# Patient Record
Sex: Male | Born: 1990 | Race: White | Hispanic: No | Marital: Single | Smoking: Never smoker
Health system: Southern US, Community
[De-identification: ages and names within clinical notes are randomized; demographics above are authoritative.]

## PROBLEM LIST (undated history)

## (undated) DIAGNOSIS — K219 Gastro-esophageal reflux disease without esophagitis: Secondary | ICD-10-CM

---
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
## 2014-01-14 ENCOUNTER — Emergency Department: Payer: Self-pay | Admitting: Emergency Medicine

## 2016-10-12 DIAGNOSIS — E039 Hypothyroidism, unspecified: Secondary | ICD-10-CM | POA: Insufficient documentation

## 2017-01-13 DIAGNOSIS — Z6841 Body Mass Index (BMI) 40.0 and over, adult: Secondary | ICD-10-CM

## 2017-03-12 ENCOUNTER — Emergency Department
Admission: EM | Admit: 2017-03-12 | Discharge: 2017-03-12 | Disposition: A | Discharge status: Home / Self Care | Payer: Self-pay | Attending: Emergency Medicine | Admitting: Emergency Medicine

## 2017-03-12 ENCOUNTER — Encounter: Payer: Self-pay | Admitting: Emergency Medicine

## 2017-03-12 DIAGNOSIS — F418 Other specified anxiety disorders: Secondary | ICD-10-CM | POA: Insufficient documentation

## 2017-03-12 DIAGNOSIS — Z711 Person with feared health complaint in whom no diagnosis is made: Secondary | ICD-10-CM | POA: Insufficient documentation

## 2017-03-12 DIAGNOSIS — N481 Balanitis: Secondary | ICD-10-CM | POA: Insufficient documentation

## 2017-03-12 DIAGNOSIS — K1120 Sialoadenitis, unspecified: Secondary | ICD-10-CM | POA: Insufficient documentation

## 2017-03-12 LAB — CBC WITH DIFFERENTIAL/PLATELET
BASOS ABS: 0 10*3/uL (ref 0–0.1)
BASOS PCT: 0 %
EOS PCT: 5 %
Eosinophils Absolute: 0.5 10*3/uL (ref 0–0.7)
HCT: 42.2 % (ref 40.0–52.0)
Hemoglobin: 14.3 g/dL (ref 13.0–18.0)
LYMPHS PCT: 21 %
Lymphs Abs: 2.2 10*3/uL (ref 1.0–3.6)
MCH: 28 pg (ref 26.0–34.0)
MCHC: 33.8 g/dL (ref 32.0–36.0)
MCV: 82.9 fL (ref 80.0–100.0)
Monocytes Absolute: 0.7 10*3/uL (ref 0.2–1.0)
Monocytes Relative: 7 %
NEUTROS ABS: 7.3 10*3/uL — AB (ref 1.4–6.5)
Neutrophils Relative %: 67 %
PLATELETS: 298 10*3/uL (ref 150–440)
RBC: 5.09 MIL/uL (ref 4.40–5.90)
RDW: 13.1 % (ref 11.5–14.5)
WBC: 10.7 10*3/uL — AB (ref 3.8–10.6)

## 2017-03-12 LAB — COMPREHENSIVE METABOLIC PANEL
ALBUMIN: 4.4 g/dL (ref 3.5–5.0)
ALT: 21 U/L (ref 17–63)
AST: 24 U/L (ref 15–41)
Alkaline Phosphatase: 83 U/L (ref 38–126)
Anion gap: 10 (ref 5–15)
BUN: 7 mg/dL (ref 6–20)
CALCIUM: 9.4 mg/dL (ref 8.9–10.3)
CO2: 24 mmol/L (ref 22–32)
Chloride: 104 mmol/L (ref 101–111)
Creatinine, Ser: 0.63 mg/dL (ref 0.61–1.24)
GFR calc Af Amer: >60 mL/min (ref >60)
GFR calc non Af Amer: >60 mL/min (ref >60)
Glucose, Bld: 134 mg/dL — ABNORMAL HIGH (ref 65–99)
POTASSIUM: 3.8 mmol/L (ref 3.5–5.1)
SODIUM: 138 mmol/L (ref 135–145)
TOTAL PROTEIN: 8.3 g/dL — AB (ref 6.5–8.1)
Total Bilirubin: 0.6 mg/dL (ref 0.3–1.2)

## 2017-03-12 LAB — URINALYSIS, COMPLETE (UACMP) WITH MICROSCOPIC
BILIRUBIN URINE: NEGATIVE
Bacteria, UA: NONE SEEN
GLUCOSE, UA: NEGATIVE mg/dL
HGB URINE DIPSTICK: NEGATIVE
Ketones, ur: NEGATIVE mg/dL
LEUKOCYTES UA: NEGATIVE
NITRITE: NEGATIVE
PROTEIN: NEGATIVE mg/dL
RBC / HPF: NONE SEEN RBC/hpf (ref 0–5)
Specific Gravity, Urine: 1.004 — ABNORMAL LOW (ref 1.005–1.030)
pH: 7 (ref 5.0–8.0)

## 2017-03-12 LAB — CHLAMYDIA/NGC RT PCR (ARMC ONLY)
Chlamydia Tr: NOT DETECTED
N gonorrhoeae: NOT DETECTED

## 2017-03-12 LAB — RAPID HIV SCREEN (HIV 1/2 AB+AG)
HIV 1/2 ANTIBODIES: NONREACTIVE
HIV-1 P24 Antigen - HIV24: NONREACTIVE

## 2017-03-12 MED ORDER — LORAZEPAM 2 MG/ML IJ SOLN
1.0000 mg | Freq: Once | INTRAMUSCULAR | Status: AC
  Administered 2017-03-12: 1 mg via INTRAVENOUS
  Filled 2017-03-12: qty 1

## 2017-03-12 MED ORDER — MUPIROCIN CALCIUM 2 % EX CREA: TOPICAL_CREAM | CUTANEOUS | 0 refills | Status: DC

## 2017-03-12 MED ORDER — AMOXICILLIN-POT CLAVULANATE 875-125 MG PO TABS: 1.0000 | ORAL_TABLET | Freq: Two times a day (BID) | ORAL | 0 refills | Status: DC

## 2017-03-12 NOTE — ED Notes (Signed)
Right hand IV removed.  IV intact and bleeding controlled.

## 2017-03-12 NOTE — ED Notes (Signed)
Spoke with Dr Alphonzo Lemmings and states he can go to flex

## 2017-03-12 NOTE — ED Notes (Signed)
Has been seen by PCP in June for groin rash  Given cream for same

## 2017-03-12 NOTE — ED Triage Notes (Addendum)
States he is having a anxiety attack  States he has had sores in mouth and on penis  Also now having neck pain and some n/v    Sx's started with in 1 year  States he really needs to talk with someone

## 2017-03-12 NOTE — ED Provider Notes (Signed)
[facility] Emergency Department Provider Note  ____________________________________________  Time seen: Approximately 12:29 PM  I have reviewed the triage vital signs and the nursing notes.   HISTORY  Chief Complaint Panic Attack    HPI Frederick Chandler is a 26 y.o. male who presents emergency department concern for anxiety symptoms.Patient reports that he has had diffuse, nonspecific symptoms over the past year. Patient reports that he does look his symptoms on google and Web M.D. and is concerned as he has seen HIV as a differential multiple times. Patient reports that he has a history of underlying anxiety for which she believes he should see psychiatry but has not done so. Patient denies any suicidal or homicidal ideations. Patient reports that he has extreme anxiety over medical health. She reports that he has had aphthous ulcers 1 year, GERD-like symptoms, intermittent abdominal pain, intermittent diarrhea. Patient reports that he has had a penile rash that was evaluated by primary care and diagnosed with fungal jock itch. Patient reports he has been on a topical with no relief of symptoms the rash has improved. At this time, patient is requesting HIV testing. Patient denies any headache, visual changes, chest pain, shortness of breath, abdominal pain currently, nausea or vomiting, diarrhea or constipation. No dysuria or polyuria. No hematuria.   History reviewed. No pertinent past medical history.  There are no active problems to display for this patient.   History reviewed. No pertinent surgical history.  Prior to Admission medications   Medication Sig Start Date End Date Taking? Authorizing Provider  amoxicillin-clavulanate (AUGMENTIN) 875-125 MG tablet Take 1 tablet by mouth 2 (two) times daily. 03/12/17   Cuthriell, Delorise Royals, PA-C  mupirocin cream (BACTROBAN) 2 % Apply to affected area 3 times daily 03/12/17   Cuthriell, Delorise Royals, PA-C     Allergies Patient has no known allergies.  No family history on file.  Social History Social History  Substance Use Topics  . Smoking status: Never Smoker  . Smokeless tobacco: Never Used  . Alcohol use No     Review of Systems  Constitutional: No fever/chills Eyes: No visual changes. No discharge ENT: Positive for aphthous ulcers to the right upper gum. Cardiovascular: no chest pain. Respiratory: no cough. No SOB. Gastrointestinal: Intermittent abdominal pain but none currently.  No nausea, no vomiting.  No diarrhea.  No constipation. Genitourinary: Negative for dysuria. No hematuria. Penile rash. Musculoskeletal: Negative for musculoskeletal pain. Skin: Negative for rash, abrasions, lacerations, ecchymosis. Neurological: Negative for headaches, focal weakness or numbness. 10-point ROS otherwise negative.  ____________________________________________   PHYSICAL EXAM:  VITAL SIGNS: ED Triage Vitals [03/12/17 1200]  Enc Vitals Group     BP (!) 155/97     Pulse Rate (!) 110     Resp 20     Temp 99.3 F (37.4 C)     Temp Source Oral     SpO2 100 %     Weight 290 lb (131.5 kg)     Height 6\' 2"  (1.88 m)     Head Circumference      Peak Flow      Pain Score      Pain Loc      Pain Edu?      Excl. in GC?      Constitutional: Alert and oriented. Well appearing and in no acute distress. Eyes: Conjunctivae are normal. PERRL. EOMI. Head: Atraumatic. ENT:      Ears:       Nose: No congestion/rhinnorhea.  Mouth/Throat: Mucous membranes are moist. Aphthous ulcer noted to right buccal lining. Neck: No stridor.  No cervical spine tenderness to palpation. Neck is supple full range of motion. Hematological/Lymphatic/Immunilogical: Mildly enlarged left submandibular lymph node, no other cervical lymphadenopathy. No posterior cervical lymphadenopathy. Cardiovascular: Normal rate, regular rhythm. Normal S1 and S2.  Good peripheral circulation. Respiratory: Normal  respiratory effort without tachypnea or retractions. Lungs CTAB. Good air entry to the bases with no decreased or absent breath sounds. Gastrointestinal: Bowel sounds 4 quadrants. Soft and nontender to palpation. No guarding or rigidity. No palpable masses. No distention. No CVA tenderness. Genitourinary: Mild erythema noted just proximal to the glans of the penis. No lesions were chancres noted. No penile drainage. No tenderness to palpation over the penile shaft. No tenderness to palpation or palpable abnormalities to testicles and scrotum. Musculoskeletal: Full range of motion to all extremities. No gross deformities appreciated. Neurologic:  Normal speech and language. No gross focal neurologic deficits are appreciated.  Skin:  Skin is warm, dry and intact. No rash noted. Psychiatric: Patient appears very anxious.Marland Kitchen Speech and behavior are normal. Patient exhibits appropriate insight and judgement. No homicidal or suicidal ideation.   ____________________________________________   LABS (all labs ordered are listed, but only abnormal results are displayed)  Labs Reviewed  URINALYSIS, COMPLETE (UACMP) WITH MICROSCOPIC - Abnormal; Notable for the following:       Result Value   Color, Urine STRAW (*)    APPearance CLEAR (*)    Specific Gravity, Urine 1.004 (*)    Squamous Epithelial / LPF 0-5 (*)    All other components within normal limits  COMPREHENSIVE METABOLIC PANEL - Abnormal; Notable for the following:    Glucose, Bld 134 (*)    Total Protein 8.3 (*)    All other components within normal limits  CBC WITH DIFFERENTIAL/PLATELET - Abnormal; Notable for the following:    WBC 10.7 (*)    Neutro Abs 7.3 (*)    All other components within normal limits  CHLAMYDIA/NGC RT PCR ([facility] ONLY)  RAPID HIV SCREEN (HIV 1/2 AB+AG)  RPR   ____________________________________________  EKG   ____________________________________________  RADIOLOGY   No results  found.  ____________________________________________    PROCEDURES  Procedure(s) performed:    Procedures    Medications  LORazepam (ATIVAN) injection 1 mg (1 mg Intravenous Given 03/12/17 1312)     ____________________________________________   INITIAL IMPRESSION / ASSESSMENT AND PLAN / ED COURSE  Pertinent labs & imaging results that were available during my care of the patient were reviewed by me and considered in my medical decision making (see chart for details).  Review of the [place] CSRS was performed in accordance of the [facility] prior to dispensing any controlled drugs.     Patient's diagnosis is consistent with severe anxiety about health, feared complaint without diagnosis, sialoadenitis and balanitis. Patient presented to the emergency department with borderline panic attack after anxiety about health to include possible HIV infection. Patient had no known contact with HIV but had looked his nonspecific symptoms for the past year up on global and with Web M.D. And had slef determined he may have HIV. Rapid HIV was negative. Gonorrhea and chlamydia were negative. Patient has multiple symptoms consistent with GERD versus IBS versus Crohn's. I recommend patient follow-up with GI for these complaints. Labs were reassuring with finding of mildly elevated white blood cell count. This is consistent with sialoadenitis on exam. Patient has also been followed by his primary care for rash on his  penis. This appears to be improving with medication but does have a balanitis appearance. Patient will be placed on topical antibiotic for same.. Patient will be discharged home with prescriptions for Augmentin and topical mupirocin. Patient is to follow up with primary care and GI as needed or otherwise directed. Patient is given ED precautions to return to the ED for any worsening or new symptoms.     ____________________________________________  FINAL CLINICAL IMPRESSION(S) / ED  DIAGNOSES  Final diagnoses:  Sialoadenitis  Balanitis  Anxiety about health  Feared complaint without diagnosis      NEW MEDICATIONS STARTED DURING THIS VISIT:  New Prescriptions   AMOXICILLIN-CLAVULANATE (AUGMENTIN) 875-125 MG TABLET    Take 1 tablet by mouth 2 (two) times daily.   MUPIROCIN CREAM (BACTROBAN) 2 %    Apply to affected area 3 times daily        This chart was dictated using voice recognition software/Dragon. Despite best efforts to proofread, errors can occur which can change the meaning. Any change was purely unintentional.    Kalim, Kissel, PA-C 03/12/17 1527    Governor Rooks, MD 03/13/17 1524

## 2017-03-13 LAB — RPR: RPR Ser Ql: NONREACTIVE

## 2017-03-29 ENCOUNTER — Other Ambulatory Visit
Admission: RE | Admit: 2017-03-29 | Discharge: 2017-03-29 | Disposition: A | Discharge status: Home / Self Care | Payer: Self-pay | Source: Ambulatory Visit | Attending: Gastroenterology | Admitting: Gastroenterology

## 2017-03-29 ENCOUNTER — Ambulatory Visit (INDEPENDENT_AMBULATORY_CARE_PROVIDER_SITE_OTHER): Payer: Self-pay | Admitting: Gastroenterology

## 2017-03-29 ENCOUNTER — Encounter: Payer: Self-pay | Admitting: Gastroenterology

## 2017-03-29 VITALS — BP 128/85 | HR 111 | Temp 98.2°F | Wt 305.6 lb

## 2017-03-29 DIAGNOSIS — R109 Unspecified abdominal pain: Secondary | ICD-10-CM | POA: Insufficient documentation

## 2017-03-29 DIAGNOSIS — K625 Hemorrhage of anus and rectum: Secondary | ICD-10-CM | POA: Insufficient documentation

## 2017-03-29 DIAGNOSIS — K219 Gastro-esophageal reflux disease without esophagitis: Secondary | ICD-10-CM

## 2017-03-29 DIAGNOSIS — R140 Abdominal distension (gaseous): Secondary | ICD-10-CM

## 2017-03-29 LAB — TSH: TSH: 3.505 u[IU]/mL (ref 0.350–4.500)

## 2017-03-29 MED ORDER — OMEPRAZOLE 40 MG PO CPDR: 40.0000 mg | DELAYED_RELEASE_CAPSULE | Freq: Every day | ORAL | 3 refills | Status: AC

## 2017-03-29 MED ORDER — OMEPRAZOLE 40 MG PO CPDR: 40.0000 mg | DELAYED_RELEASE_CAPSULE | Freq: Every day | ORAL | 3 refills | Status: DC

## 2017-03-29 NOTE — Progress Notes (Signed)
Wyline Mood MD, MRCP(U.K) [address]  [address]  [place], [place] [address]  Main: [phone]  Fax: [phone]   Gastroenterology Consultation  Referring Provider:     Nira Retort Primary Care Physician:  Nira Retort Primary Gastroenterologist:  Dr. Wyline Mood  Reason for Consultation:     *Abdominal pain         HPI:   Frederick Chandler is a 26 y.o. y/o Frederick Chandler has been referred by the ER after he visited on 03/12/17 , he has a history of anxiety . He was referred to see me for a variety of symptoms with concerns for GERD,IBD.  He says that a year back noticed some blood in his stool every time he had a bowel movement.Recurred 6 days back, saw bright red blood in the toilet bowel, no hard stool , presently no blood in his stools, some change in the shape of his stools, no colonoscopy previously . Grand dad had possible colon cancer. Recently gained weight.   Says he has had nausea for a year, better if he eats, does not throw up , better when he takes a hot shower, no cigarettes , no exposure to marijuana. Denies any illegal drug use. Solids and liquids equally affected. No heartburn.   Abdominal pain - 1 year, all over his abdomen , chest , past several days lower down , radiates to up and down , describes the pain as dull , does not wake up from sleep . Each episode lasts for a few hours and then eases off, no agrevating or relieving factors, no nsaid use. Not related to meals.   Excess of gas a year, bloating , abdominal pain worse when bloated, when he passes gas feels better, very foul smelling gas. Denies use of artificial sugars, stools are sloppy and mushy .     Labs 03/12/17: CMP,CBC- Normal  No past medical history on file.  No past surgical history on file.  Prior to Admission medications   Medication Sig Start Date End Date Taking? Authorizing Provider  fluticasone (FLONASE) 50 MCG/ACT nasal spray Place into the nose. 10/12/16 10/12/17 Yes  [provider]  hydrocortisone cream 1 % Apply topically 2 (two) times daily for 14 days 03/07/17  Yes [provider]  amoxicillin-clavulanate (AUGMENTIN) 875-125 MG tablet Take 1 tablet by mouth 2 (two) times daily. 03/12/17   Cuthriell, Delorise Royals, PA-C  mupirocin cream (BACTROBAN) 2 % Apply to affected area 3 times daily 03/12/17   Cuthriell, Delorise Royals, PA-C    No family history on file.   Social History  Substance Use Topics  . Smoking status: Never Smoker  . Smokeless tobacco: Never Used  . Alcohol use No    Allergies as of 03/29/2017  . (No Known Allergies)    Review of Systems:    All systems reviewed and negative except where noted in HPI.   Physical Exam:  There were no vitals taken for this visit. No LMP for male patient. Psych:  Alert and cooperative. Normal mood and affect. General:   Alert,  Well-developed, well-nourished, pleasant and cooperative in NAD Head:  Normocephalic and atraumatic. Eyes:  Sclera clear, no icterus.   Conjunctiva pink. Ears:  Normal auditory acuity. Nose:  No deformity, discharge, or lesions. Mouth:  No deformity or lesions,oropharynx pink & moist. Neck:  Supple; no masses or thyromegaly. Lungs:  Respirations even and unlabored.  Clear throughout to auscultation.   No wheezes, crackles, or rhonchi. No acute distress.  Heart:  Regular rate and rhythm; no murmurs, clicks, rubs, or gallops. Abdomen:  Normal bowel sounds.  No bruits.  Soft, non-tender and non-distended without masses, hepatosplenomegaly or hernias noted.  No guarding or rebound tenderness.    Msk:  Symmetrical without gross deformities. Good, equal movement & strength bilaterally. Pulses:  Normal pulses noted. Extremities:  No clubbing or edema.  No cyanosis. Neurologic:  Alert and oriented x3;  grossly normal neurologically. Skin:  Intact without significant lesions or rashes. No jaundice. Lymph Nodes:  No significant cervical adenopathy. Psych:  Alert and  cooperative. Normal mood and affect.  Imaging Studies: No results found.  Assessment and Plan:   Frederick Chandler is a 26 y.o. y/o male has been referred for blood in stool , abdominal pain. Likely rectal bleeding from hemorrhoids, lots of bloating which may be due to diet rich in vegetables and fiber , this in turn may be causing the abdominal discomfort.    1. EGD+colonoscopy  2. PPI 3. Check for celiac disease and TSH 4. LOW FODMAP diet  5. If no better at next visit will consider a course of antibiotics to treat SIBO +/- CT scan    I have discussed alternative options, risks & benefits,  which include, but are not limited to, bleeding, infection, perforation,respiratory complication & drug reaction.  The patient agrees with this plan & written consent will be obtained.     Follow up in 6-8 weeks   Dr Wyline MoodKiran Adiva Boettner MD,MRCP(U.K)

## 2017-03-31 LAB — CELIAC PANEL 10
Antigliadin Abs, IgA: 7 units (ref 0–19)
Endomysial Ab, IgA: NEGATIVE
GLIADIN IGG: 3 U (ref 0–19)
IGA: 417 mg/dL — AB (ref 90–386)
Tissue Transglut Ab: <2 U/mL (ref 0–5)

## 2017-04-01 ENCOUNTER — Telehealth: Payer: Self-pay

## 2017-04-01 NOTE — Telephone Encounter (Signed)
Advised patient of results per Dr. Anna.  

## 2017-04-01 NOTE — Telephone Encounter (Signed)
-----   Message from Wyline MoodKiran Anna, MD sent at 03/31/2017  8:58 AM EDT ----- Celiac serology negative

## 2017-04-21 ENCOUNTER — Ambulatory Visit: Payer: Self-pay | Admitting: Anesthesiology

## 2017-04-21 ENCOUNTER — Ambulatory Visit
Admission: RE | Admit: 2017-04-21 | Discharge: 2017-04-21 | Disposition: A | Discharge status: Home / Self Care | Payer: Self-pay | Source: Ambulatory Visit | Attending: Gastroenterology | Admitting: Gastroenterology

## 2017-04-21 ENCOUNTER — Encounter
Admission: RE | Disposition: A | Discharge status: Home / Self Care | Payer: Self-pay | Source: Ambulatory Visit | Attending: Gastroenterology

## 2017-04-21 ENCOUNTER — Encounter: Payer: Self-pay | Admitting: *Deleted

## 2017-04-21 DIAGNOSIS — K625 Hemorrhage of anus and rectum: Secondary | ICD-10-CM | POA: Insufficient documentation

## 2017-04-21 DIAGNOSIS — K2950 Unspecified chronic gastritis without bleeding: Secondary | ICD-10-CM | POA: Insufficient documentation

## 2017-04-21 DIAGNOSIS — K640 First degree hemorrhoids: Secondary | ICD-10-CM | POA: Insufficient documentation

## 2017-04-21 DIAGNOSIS — R109 Unspecified abdominal pain: Secondary | ICD-10-CM

## 2017-04-21 DIAGNOSIS — Z79899 Other long term (current) drug therapy: Secondary | ICD-10-CM | POA: Insufficient documentation

## 2017-04-21 DIAGNOSIS — Z6837 Body mass index (BMI) 37.0-37.9, adult: Secondary | ICD-10-CM | POA: Insufficient documentation

## 2017-04-21 DIAGNOSIS — K319 Disease of stomach and duodenum, unspecified: Secondary | ICD-10-CM

## 2017-04-21 DIAGNOSIS — R140 Abdominal distension (gaseous): Secondary | ICD-10-CM

## 2017-04-21 DIAGNOSIS — K219 Gastro-esophageal reflux disease without esophagitis: Secondary | ICD-10-CM | POA: Insufficient documentation

## 2017-04-21 HISTORY — PX: COLONOSCOPY WITH PROPOFOL: SHX5780

## 2017-04-21 HISTORY — PX: ESOPHAGOGASTRODUODENOSCOPY (EGD) WITH PROPOFOL: SHX5813

## 2017-04-21 HISTORY — DX: Gastro-esophageal reflux disease without esophagitis: K21.9

## 2017-04-21 SURGERY — ESOPHAGOGASTRODUODENOSCOPY (EGD) WITH PROPOFOL
Anesthesia: General

## 2017-04-21 MED ORDER — FENTANYL CITRATE (PF) 100 MCG/2ML IJ SOLN
INTRAMUSCULAR | Status: AC
  Filled 2017-04-21: qty 2

## 2017-04-21 MED ORDER — LIDOCAINE 2% (20 MG/ML) 5 ML SYRINGE
INTRAMUSCULAR | Status: DC | PRN
  Administered 2017-04-21: 40 mg via INTRAVENOUS

## 2017-04-21 MED ORDER — SODIUM CHLORIDE 0.9 % IV SOLN
INTRAVENOUS | Status: DC
Start: 2017-04-21 — End: 2017-04-21
  Administered 2017-04-21: 11:00:00 via INTRAVENOUS
  Administered 2017-04-21: 1000 mL via INTRAVENOUS

## 2017-04-21 MED ORDER — FENTANYL CITRATE (PF) 100 MCG/2ML IJ SOLN
INTRAMUSCULAR | Status: DC | PRN
  Administered 2017-04-21 (×2): 50 ug via INTRAVENOUS

## 2017-04-21 MED ORDER — PROPOFOL 10 MG/ML IV BOLUS
INTRAVENOUS | Status: DC | PRN
  Administered 2017-04-21: 100 mg via INTRAVENOUS

## 2017-04-21 MED ORDER — PHENYLEPHRINE HCL 10 MG/ML IJ SOLN
INTRAMUSCULAR | Status: DC | PRN
  Administered 2017-04-21: 100 ug via INTRAVENOUS

## 2017-04-21 MED ORDER — PROPOFOL 500 MG/50ML IV EMUL
INTRAVENOUS | Status: DC | PRN
  Administered 2017-04-21: 180 ug/kg/min via INTRAVENOUS

## 2017-04-21 MED ORDER — MIDAZOLAM HCL 2 MG/2ML IJ SOLN
INTRAMUSCULAR | Status: AC
  Filled 2017-04-21: qty 2

## 2017-04-21 MED ORDER — MIDAZOLAM HCL 5 MG/5ML IJ SOLN
INTRAMUSCULAR | Status: DC | PRN
  Administered 2017-04-21 (×2): 1 mg via INTRAVENOUS

## 2017-04-21 MED ORDER — LIDOCAINE HCL (PF) 2 % IJ SOLN
INTRAMUSCULAR | Status: AC
  Filled 2017-04-21: qty 2

## 2017-04-21 NOTE — Op Note (Signed)
[facility] Gastroenterology Patient Name: Frederick Chandler Procedure Date: 04/21/2017 11:32 AM MRN: [id number] Account #: [id number] Date of Birth: May 24, 1991 Admit Type: Outpatient Age: 26 Room: [facility] ENDO ROOM 4 Gender: Male Note Status: Finalized Procedure:            Upper GI endoscopy Indications:          Dyspepsia Providers:            Wyline Mood MD, MD Referring MD:         No Local Md, MD (Referring MD) Medicines:            Monitored Anesthesia Care Complications:        No immediate complications. Procedure:            Pre-Anesthesia Assessment:                       - The risks and benefits of the procedure and the                        sedation options and risks were discussed with the                        patient. All questions were answered and informed                        consent was obtained.                       - ASA Grade Assessment: II - A patient with mild                        systemic disease.                       After obtaining informed consent, the endoscope was                        passed under direct vision. Throughout the procedure,                        the patient's blood pressure, pulse, and oxygen                        saturations were monitored continuously. The Endoscope                        was introduced through the mouth, and advanced to the                        third part of duodenum. The upper GI endoscopy was                        accomplished with ease. The patient tolerated the                        procedure well. Findings:      The stomach was normal.      The esophagus was normal.      The entire examined stomach was normal. Biopsies were taken with a cold       forceps for histology.      A  fold appeared thickened and nodular at the junction of the bulb and       second portion of the duodenum. Biopsies were taken with a cold forceps       for histology. Impression:           - Normal stomach.                      - Normal esophagus.                       - Normal stomach. Biopsied.                       - Mucosal changes in the duodenum. Biopsied. Recommendation:       - Await pathology results.                       - Perform a colonoscopy today. Procedure Code(s):    --- Professional ---                       [phone], Esophagogastroduodenoscopy, flexible, transoral;                        with biopsy, single or multiple Diagnosis Code(s):    --- Professional ---                       K31.89, Other diseases of stomach and duodenum                       R10.13, Epigastric pain CPT copyright 2016 American Medical Association. All rights reserved. The codes documented in this report are preliminary and upon coder review may  be revised to meet current compliance requirements. Wyline Mood, MD Wyline Mood MD, MD 04/21/2017 11:46:02 AM This report has been signed electronically. Number of Addenda: 0 Note Initiated On: 04/21/2017 11:32 AM      [facility]

## 2017-04-21 NOTE — Transfer of Care (Signed)
Immediate Anesthesia Transfer of Care Note  Patient: Frederick Chandler  Procedure(s) Performed: Procedure(s): ESOPHAGOGASTRODUODENOSCOPY (EGD) WITH PROPOFOL (N/A) COLONOSCOPY WITH PROPOFOL (N/A)  Patient Location: PACU and Endoscopy Unit  Anesthesia Type:General  Level of Consciousness: drowsy  Airway & Oxygen Therapy: Patient Spontanous Breathing and Patient connected to nasal cannula oxygen  Post-op Assessment: Report given to RN and Post -op Vital signs reviewed and stable  Post vital signs: Reviewed and stable  Last Vitals:  Vitals:   04/21/17 0952  BP: 111/81  Pulse: 95  Resp: 16  Temp: (!) 35.6 C  SpO2: 100%    Last Pain:  Vitals:   04/21/17 0952  TempSrc: Tympanic         Complications: No apparent anesthesia complications

## 2017-04-21 NOTE — Anesthesia Preprocedure Evaluation (Signed)
Anesthesia Evaluation  Patient identified by MRN, date of birth, ID band Patient awake    Reviewed: Allergy & Precautions, H&P , NPO status , Patient's Chart, lab work & pertinent test results, reviewed documented beta blocker date and time   Airway Mallampati: II   Neck ROM: full    Dental  (+) Teeth Intact   Pulmonary neg pulmonary ROS,    Pulmonary exam normal        Cardiovascular Exercise Tolerance: Good negative cardio ROS Normal cardiovascular exam Rhythm:regular Rate:Normal     Neuro/Psych negative neurological ROS  negative psych ROS   GI/Hepatic negative GI ROS, Neg liver ROS, GERD  Medicated,  Endo/Other  negative endocrine ROSHypothyroidism Morbid obesity  Renal/GU negative Renal ROS  negative genitourinary   Musculoskeletal   Abdominal   Peds  Hematology negative hematology ROS (+)   Anesthesia Other Findings Past Medical History: No date: GERD (gastroesophageal reflux disease) No past surgical history on file. BMI    Body Mass Index:  37.88 kg/m     Reproductive/Obstetrics negative OB ROS                             Anesthesia Physical Anesthesia Plan  ASA: II  Anesthesia Plan: General   Post-op Pain Management:    Induction:   PONV Risk Score and Plan:   Airway Management Planned:   Additional Equipment:   Intra-op Plan:   Post-operative Plan:   Informed Consent: I have reviewed the patients History and Physical, chart, labs and discussed the procedure including the risks, benefits and alternatives for the proposed anesthesia with the patient or authorized representative who has indicated his/her understanding and acceptance.   Dental Advisory Given  Plan Discussed with: CRNA  Anesthesia Plan Comments:         Anesthesia Quick Evaluation

## 2017-04-21 NOTE — Anesthesia Post-op Follow-up Note (Signed)
Anesthesia QCDR form completed.        

## 2017-04-21 NOTE — Op Note (Signed)
[facility] Gastroenterology Patient Name: Frederick Chandler Procedure Date: 04/21/2017 11:32 AM MRN: [id number] Account #: [id number] Date of Birth: 07/25/1991 Admit Type: Outpatient Age: 26 Room: [facility] ENDO ROOM 4 Gender: Male Note Status: Finalized Procedure:            Colonoscopy Indications:          Rectal bleeding Providers:            Wyline Mood MD, MD Referring MD:         No Local Md, MD (Referring MD) Medicines:            Monitored Anesthesia Care Complications:        No immediate complications. Procedure:            Pre-Anesthesia Assessment:                       - Prior to the procedure, a History and Physical was                        performed, and patient medications, allergies and                        sensitivities were reviewed. The patient's tolerance of                        previous anesthesia was reviewed.                       - The risks and benefits of the procedure and the                        sedation options and risks were discussed with the                        patient. All questions were answered and informed                        consent was obtained.                       - ASA Grade Assessment: II - A patient with mild                        systemic disease.                       After obtaining informed consent, the colonoscope was                        passed under direct vision. Throughout the procedure,                        the patient's blood pressure, pulse, and oxygen                        saturations were monitored continuously. The                        Colonoscope was introduced through the anus and                        advanced  to the the cecum, identified by the                        appendiceal orifice, IC valve and transillumination.                        The colonoscopy was performed with ease. The patient                        tolerated the procedure well. The quality of the bowel           preparation was good. Findings:      The perianal and digital rectal examinations were normal.      Non-bleeding internal hemorrhoids were found during retroflexion. The       hemorrhoids were small and Grade I (internal hemorrhoids that do not       prolapse).      The exam was otherwise without abnormality on direct and retroflexion       views. Impression:           - Non-bleeding internal hemorrhoids.                       - The examination was otherwise normal on direct and                        retroflexion views.                       - No specimens collected. Recommendation:       - Discharge patient to home (with escort).                       - Resume previous diet.                       - Continue present medications.                       - Return to my office in 2 months. Procedure Code(s):    --- Professional ---                       [phone], Colonoscopy, flexible; diagnostic, including                        collection of specimen(s) by brushing or washing, when                        performed (separate procedure) Diagnosis Code(s):    --- Professional ---                       K64.0, First degree hemorrhoids                       K62.5, Hemorrhage of anus and rectum CPT copyright 2016 American Medical Association. All rights reserved. The codes documented in this report are preliminary and upon coder review may  be revised to meet current compliance requirements. Wyline Mood, MD Wyline Mood MD, MD 04/21/2017 11:58:23 AM This report has been signed electronically. Number of Addenda: 0 Note Initiated On: 04/21/2017 11:32 AM Scope Withdrawal Time: 0 hours 7 minutes 50 seconds  Total Procedure  Duration: 0 hours 9 minutes 32 seconds       [facility]

## 2017-04-21 NOTE — H&P (Signed)
  Wyline Mood MD [address] [place], [place] [address] Phone: [phone] Fax : [phone]  Primary Care Physician:  Nira Retort Primary Gastroenterologist:  Dr. Wyline Mood   Pre-Procedure History & Physical: HPI:  Frederick Chandler is a 26 y.o. male is here for an endoscopy and colonoscopy.   Past Medical History:  Diagnosis Date  . GERD (gastroesophageal reflux disease)     No past surgical history on file.  Prior to Admission medications   Medication Sig Start Date End Date Taking? Authorizing Provider  omeprazole (PRILOSEC) 40 MG capsule Take 1 capsule (40 mg total) by mouth daily. 03/29/17  Yes Wyline Mood, MD    Allergies as of 03/29/2017  . (No Known Allergies)    No family history on file.  Social History   Social History  . Marital status: Single    Spouse name: N/A  . Number of children: N/A  . Years of education: N/A   Occupational History  . Not on file.   Social History Main Topics  . Smoking status: Never Smoker  . Smokeless tobacco: Never Used  . Alcohol use No  . Drug use: No  . Sexual activity: Not on file   Other Topics Concern  . Not on file   Social History Narrative  . No narrative on file    Review of Systems: See HPI, otherwise negative ROS  Physical Exam: BP 111/81   Pulse 95   Temp (!) 96 F (35.6 C) (Tympanic)   Resp 16   Ht  (1.88 m)   Wt 295 lb (133.8 kg)   SpO2 100%   BMI 37.88 kg/m  General:   Alert,  pleasant and cooperative in NAD Head:  Normocephalic and atraumatic. Neck:  Supple; no masses or thyromegaly. Lungs:  Clear throughout to auscultation.    Heart:  Regular rate and rhythm. Abdomen:  Soft, nontender and nondistended. Normal bowel sounds, without guarding, and without rebound.   Neurologic:  Alert and  oriented x4;  grossly normal neurologically.  Impression/Plan: Frederick Chandler is here for an endoscopy and colonoscopy to be performed for abdominal pain and rectal bleeding    Risks, benefits, limitations, and alternatives regarding  endoscopy and colonoscopy have been reviewed with the patient.  Questions have been answered.  All parties agreeable.   Wyline Mood, MD  04/21/2017, 10:50 AM

## 2017-04-22 ENCOUNTER — Encounter: Payer: Self-pay | Admitting: Gastroenterology

## 2017-04-25 LAB — SURGICAL PATHOLOGY

## 2017-04-25 NOTE — Anesthesia Postprocedure Evaluation (Signed)
Anesthesia Post Note  Patient: DIMAS SCHECK  Procedure(s) Performed: ESOPHAGOGASTRODUODENOSCOPY (EGD) WITH PROPOFOL (N/A ) COLONOSCOPY WITH PROPOFOL (N/A )  Patient location during evaluation: PACU Anesthesia Type: General Level of consciousness: awake and alert Pain management: pain level controlled Vital Signs Assessment: post-procedure vital signs reviewed and stable Respiratory status: spontaneous breathing, nonlabored ventilation, respiratory function stable and patient connected to nasal cannula oxygen Cardiovascular status: blood pressure returned to baseline and stable Postop Assessment: no apparent nausea or vomiting Anesthetic complications: no     Last Vitals:  Vitals:   04/21/17 1236 04/21/17 1246  BP: 112/79 122/80  Pulse: 80 73  Resp: 15 20  Temp:    SpO2: 99% 99%    Last Pain:  Vitals:   04/22/17 0746  TempSrc:   PainSc: 0-No pain                 Yevette Edwards

## 2017-05-01 ENCOUNTER — Encounter: Payer: Self-pay | Admitting: Gastroenterology

## 2017-07-07 ENCOUNTER — Ambulatory Visit: Payer: Self-pay | Admitting: Gastroenterology

## 2018-10-03 ENCOUNTER — Other Ambulatory Visit: Payer: Self-pay | Admitting: Gastroenterology

## 2018-10-03 DIAGNOSIS — R140 Abdominal distension (gaseous): Secondary | ICD-10-CM

## 2018-10-03 DIAGNOSIS — K625 Hemorrhage of anus and rectum: Secondary | ICD-10-CM

## 2018-10-03 DIAGNOSIS — K219 Gastro-esophageal reflux disease without esophagitis: Secondary | ICD-10-CM

## 2018-10-03 DIAGNOSIS — R109 Unspecified abdominal pain: Secondary | ICD-10-CM

## 2019-10-29 ENCOUNTER — Ambulatory Visit: Payer: Self-pay | Attending: Internal Medicine

## 2021-02-03 ENCOUNTER — Inpatient Hospital Stay (HOSPITAL_COMMUNITY)
Admission: RE | Admit: 2021-02-03 | Discharge: 2021-02-06 | DRG: 885 | Disposition: A | Discharge status: Home / Self Care | Payer: BC Managed Care – PPO | Attending: Psychiatry | Admitting: Psychiatry

## 2021-02-03 DIAGNOSIS — F329 Major depressive disorder, single episode, unspecified: Secondary | ICD-10-CM | POA: Diagnosis present

## 2021-02-03 DIAGNOSIS — Z20822 Contact with and (suspected) exposure to covid-19: Secondary | ICD-10-CM | POA: Diagnosis present

## 2021-02-03 DIAGNOSIS — K219 Gastro-esophageal reflux disease without esophagitis: Secondary | ICD-10-CM | POA: Diagnosis present

## 2021-02-03 DIAGNOSIS — F322 Major depressive disorder, single episode, severe without psychotic features: Secondary | ICD-10-CM | POA: Diagnosis present

## 2021-02-03 DIAGNOSIS — F410 Panic disorder [episodic paroxysmal anxiety] without agoraphobia: Secondary | ICD-10-CM | POA: Diagnosis present

## 2021-02-03 DIAGNOSIS — F6089 Other specific personality disorders: Secondary | ICD-10-CM | POA: Diagnosis present

## 2021-02-03 DIAGNOSIS — R45851 Suicidal ideations: Secondary | ICD-10-CM | POA: Diagnosis present

## 2021-02-03 LAB — RESP PANEL BY RT-PCR (FLU A&B, COVID) ARPGX2
Influenza A by PCR: NEGATIVE
Influenza B by PCR: NEGATIVE
SARS Coronavirus 2 by RT PCR: NEGATIVE
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
## 2021-02-03 NOTE — H&P (Addendum)
Behavioral Health Medical Screening Exam  Frederick Chandler is a 30 y.o. male with a history of anxiety who presents to [facility] voluntarily with friends due to [employer] with a plan.  Patient's friend, [name], participates in the assessment with the patient's verbal consent. Patient states that he is here this evening because his friends requested that he come in for an evaluation.  He states that his fiance of 7 years left him 2 days ago and he has been feeling more depressed and having suicidal thoughts.  He states that the suicidal thoughts and depression began 3 weeks ago when his fiance checked herself into a psychiatric facility.  He states at that time he realized that the relationship was ending.  He states that the past 2 days he has been having episodes of panic attacks, yelling, and hitting walls.  States that during one episode he ran out of work and collapsed on the ground.  States that he made this suicidal comment in an online work for him and his boss "harped" on him about it.  He states this has worsened his anxiety.  He reports that he has been looking at medications thinking about overdosing, looking at high places, and that he has held his gun in his hand while contemplating suicide.  His friend [name]) reports that they have removed the gun and it is secured.  Patient denies a history of suicidal thoughts prior to 3 weeks ago.  He denies a history of suicide attempts.  Patient denies homicidal ideations.  He denies auditory and visual hallucinations.  No indication that he is responding to internal stimuli.  No paranoia.  Patient reports that he has not been sleeping the past 2 days and he has not eaten in the past 2 days.  He reports that his sleep and appetite were good before then.  Although he does report that he usually feels exhausted after work and goes to sleep immediately after returning home.  He denies use of alcohol, marijuana, and other substances.  UDS pending collection.  Patient  reports a history of anxiety.  He states that he was previously prescribed citalopram but has not taken in several months.  He denies any history of taking any other psychotropic medications.  He denies any other psychiatric history.  He states that he was raised primarily by his grandmother.  He states that he is not close to his family and he is unsure of any family psychiatric or substance abuse history.  Patient states that he has no children.  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  He states that he also feels like he is losing his rescue animals and this is contributing to his anxiety/depression.  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  On evaluation, patient is alert and oriented x4.  He is pleasant and cooperative.  His speech is clear and coherent, although patient is extremely tearful and difficult to understand at times.  Eye contact is minimal.  He reports his mood is depressed and anxious.  His affect is congruent with mood.  Patient taps lower extremities throughout assessment.  Patient reports suicidal ideations.  States that he has considered overdosing, jumping from a building, or shooting himself.  Patient states that he has held his gun in his hand while contemplating suicide.  His friend reports that the gun has been  removed and secured.  He denies homicidal ideations.  He denies auditory or visual hallucinations.  No indication that he is responding to internal stimuli.  Denies paranoia.  Denies substance abuse.   Associated Signs/Symptoms: Depression Symptoms:  depressed mood, anhedonia, insomnia, feelings of worthlessness/guilt, difficulty concentrating, hopelessness, suicidal thoughts with specific plan, anxiety, panic attacks, loss of energy/fatigue, decreased appetite, Duration of Depression Symptoms: More than 2 weeks  (Hypo) Manic  Symptoms:  Distractibility, Irritable Mood, Anxiety Symptoms:  Excessive Worry, Psychotic Symptoms:   Denies Duration of Psychotic Symptoms: No data recorded PTSD Symptoms: Denies history of trauma    Total Time spent with patient: 30 minutes  Psychiatric Specialty Exam:  Presentation  General Appearance: Appropriate for Environment; Fairly Groomed  Eye Contact:Minimal  Speech:Clear and Coherent; Normal Rate  Speech Volume:Normal  Handedness: No data recorded  Mood and Affect  Mood:Anxious; Depressed; Hopeless; Worthless  Affect:Congruent; Depressed; Tearful   Thought Process  Thought Processes:Coherent  Descriptions of Associations:Intact  Orientation:Full (Time, Place and Person)  Thought Content:Logical  History of Schizophrenia/Schizoaffective disorder:No  Duration of Psychotic Symptoms:No data recorded Hallucinations:Hallucinations: None  Ideas of Reference:None  Suicidal Thoughts:Suicidal Thoughts: Yes, Active SI Active Intent and/or Plan: With Intent; With Plan; With Means to Carry Out  Homicidal Thoughts:Homicidal Thoughts: No   Sensorium  Memory:Immediate Good; Recent Good; Remote Good  Judgment:Impaired  Insight:Lacking   Executive Functions  Concentration:Fair  Attention Span:Fair  Recall:Good  Fund of Knowledge:Good  Language:Good   Psychomotor Activity  Psychomotor Activity:Psychomotor Activity: Restlessness   Assets  Assets:Communication Skills; Desire for Improvement; Financial Resources/Insurance; Housing; Physical Health; Social Support; Transportation   Sleep  Sleep:Sleep: Poor    Physical Exam: Physical Exam Constitutional:      General: He is not in acute distress.    Appearance: He is not ill-appearing, toxic-appearing or diaphoretic.  HENT:     Right Ear: External ear normal.     Left Ear: External ear normal.  Eyes:     Pupils: Pupils are equal, round, and reactive to light.  Cardiovascular:      Rate and Rhythm: Normal rate.  Pulmonary:     Effort: Pulmonary effort is normal. No respiratory distress.  Musculoskeletal:        General: Normal range of motion.  Neurological:     Mental Status: He is alert and oriented to person, place, and time.  Psychiatric:        Behavior: Behavior is cooperative.        Thought Content: Thought content includes suicidal ideation. Thought content includes suicidal plan.   Review of Systems  Constitutional:  Negative for chills, diaphoresis, fever, malaise/fatigue and weight loss.  HENT:  Negative for congestion.   Respiratory:  Negative for cough and shortness of breath.   Cardiovascular:  Negative for chest pain and palpitations.  Gastrointestinal:  Positive for nausea. Negative for diarrhea and vomiting.  Neurological:  Negative for dizziness and seizures.  Psychiatric/Behavioral:  Positive for depression and suicidal ideas. Negative for hallucinations, memory loss and substance abuse. The patient is nervous/anxious and has insomnia.   All other systems reviewed and are negative.  Blood pressure 139/87, pulse (!) 112, temperature 98.2 F (36.8 C), temperature source Oral, SpO2 99 %. There is no height or weight on file to calculate BMI.  Musculoskeletal: Strength & Muscle Tone: within normal limits Gait & Station: normal Patient leans: N/A   Recommendations:  Based on my evaluation the patient does not appear to have an emergency medical condition.  Barbara Cower  Edwin Dada, NP 02/03/2021, 11:47 PM

## 2021-02-04 ENCOUNTER — Other Ambulatory Visit: Payer: Self-pay

## 2021-02-04 ENCOUNTER — Encounter (HOSPITAL_COMMUNITY): Payer: Self-pay | Admitting: Nurse Practitioner

## 2021-02-04 DIAGNOSIS — F322 Major depressive disorder, single episode, severe without psychotic features: Secondary | ICD-10-CM | POA: Diagnosis present

## 2021-02-04 LAB — COMPREHENSIVE METABOLIC PANEL
ALT: 20 U/L (ref 0–44)
AST: 17 U/L (ref 15–41)
Albumin: 4.4 g/dL (ref 3.5–5.0)
Alkaline Phosphatase: 72 U/L (ref 38–126)
Anion gap: 11 (ref 5–15)
BUN: 10 mg/dL (ref 6–20)
CO2: 23 mmol/L (ref 22–32)
Calcium: 9.5 mg/dL (ref 8.9–10.3)
Chloride: 104 mmol/L (ref 98–111)
Creatinine, Ser: 0.96 mg/dL (ref 0.61–1.24)
GFR, Estimated: >60 mL/min (ref >60)
Glucose, Bld: 112 mg/dL — ABNORMAL HIGH (ref 70–99)
Potassium: 3.8 mmol/L (ref 3.5–5.1)
Sodium: 138 mmol/L (ref 135–145)
Total Bilirubin: 1.1 mg/dL (ref 0.3–1.2)
Total Protein: 8.4 g/dL — ABNORMAL HIGH (ref 6.5–8.1)

## 2021-02-04 LAB — LIPID PANEL
Cholesterol: 157 mg/dL (ref 0–200)
HDL: 39 mg/dL — ABNORMAL LOW (ref >40)
LDL Cholesterol: 105 mg/dL — ABNORMAL HIGH (ref 0–99)
Total CHOL/HDL Ratio: 4 RATIO
Triglycerides: 66 mg/dL (ref <150)
VLDL: 13 mg/dL (ref 0–40)

## 2021-02-04 LAB — CBC
HCT: 44.8 % (ref 39.0–52.0)
Hemoglobin: 14.6 g/dL (ref 13.0–17.0)
MCH: 27.7 pg (ref 26.0–34.0)
MCHC: 32.6 g/dL (ref 30.0–36.0)
MCV: 84.8 fL (ref 80.0–100.0)
Platelets: 363 10*3/uL (ref 150–400)
RBC: 5.28 MIL/uL (ref 4.22–5.81)
RDW: 12.8 % (ref 11.5–15.5)
WBC: 13.4 10*3/uL — ABNORMAL HIGH (ref 4.0–10.5)
nRBC: 0 % (ref 0.0–0.2)

## 2021-02-04 LAB — HEMOGLOBIN A1C
Hgb A1c MFr Bld: 5.5 % (ref 4.8–5.6)
Mean Plasma Glucose: 111.15 mg/dL

## 2021-02-04 LAB — TSH: TSH: 4.586 u[IU]/mL — ABNORMAL HIGH (ref 0.350–4.500)

## 2021-02-04 LAB — T4, FREE: Free T4: 1.25 ng/dL — ABNORMAL HIGH (ref 0.61–1.12)

## 2021-02-04 MED ORDER — ALUM & MAG HYDROXIDE-SIMETH 200-200-20 MG/5ML PO SUSP: 30.0000 mL | ORAL | Status: DC | PRN

## 2021-02-04 MED ORDER — SERTRALINE HCL 25 MG PO TABS
25.0000 mg | ORAL_TABLET | Freq: Every day | ORAL | Status: DC
  Administered 2021-02-04 – 2021-02-05 (×2): 25 mg via ORAL
  Filled 2021-02-04 (×4): qty 1

## 2021-02-04 MED ORDER — HYDROXYZINE HCL 25 MG PO TABS: 25.0000 mg | ORAL_TABLET | Freq: Three times a day (TID) | ORAL | Status: DC | PRN

## 2021-02-04 MED ORDER — PANTOPRAZOLE SODIUM 40 MG PO TBEC
40.0000 mg | DELAYED_RELEASE_TABLET | Freq: Every day | ORAL | Status: DC
  Administered 2021-02-04 – 2021-02-06 (×3): 40 mg via ORAL
  Filled 2021-02-04 (×4): qty 1

## 2021-02-04 MED ORDER — ACETAMINOPHEN 325 MG PO TABS
650.0000 mg | ORAL_TABLET | Freq: Four times a day (QID) | ORAL | Status: DC | PRN
  Administered 2021-02-04: 650 mg via ORAL
  Filled 2021-02-04: qty 2

## 2021-02-04 MED ORDER — MAGNESIUM HYDROXIDE 400 MG/5ML PO SUSP: 30.0000 mL | Freq: Every day | ORAL | Status: DC | PRN

## 2021-02-04 MED ORDER — TRAZODONE HCL 50 MG PO TABS
50.0000 mg | ORAL_TABLET | Freq: Every evening | ORAL | Status: DC | PRN
  Administered 2021-02-05: 50 mg via ORAL
  Filled 2021-02-04 (×2): qty 1

## 2021-02-04 NOTE — Tx Team (Signed)
Initial Treatment Plan 02/04/2021 5:23 AM Frederick Chandler [id number]    PATIENT STRESSORS: Loss of fiancee   PATIENT STRENGTHS: Communication skills Supportive family/friends   PATIENT IDENTIFIED PROBLEMS: Depression  Suicidal Thoughts                   DISCHARGE CRITERIA:  Improved stabilization in mood, thinking, and/or behavior Verbal commitment to aftercare and medication compliance  PRELIMINARY DISCHARGE PLAN: Return to previous living arrangement  PATIENT/FAMILY INVOLVEMENT: This treatment plan has been presented to and reviewed with the patient, Frederick Chandler, and/or family member.  The patient and family have been given the opportunity to ask questions and make suggestions.  Elbert Ewings, RN 02/04/2021, 5:23 AM

## 2021-02-04 NOTE — Progress Notes (Signed)
Patient is a walk in to [facility] under voluntary committment and he is voicing suicidal ideation after he reports breaking up with his fiancee of seven years. He is Ox4 reports depression is a 9/10. He is sad and tearful on approach. He denies AVH. He does report a history of depression and taking medication in the past. He was searched on admission no contraband found on assessment.

## 2021-02-04 NOTE — B[facility] Group Notes (Signed)
The focus of this group is to help patients establish daily goals to achieve during treatment and discuss how the patient can incorporate goal setting into their daily lives to aide in recovery.  Pt did not attend group 

## 2021-02-04 NOTE — [facility] Group Notes (Signed)
02/04/2021    Type of Therapy and Topic:  Group Therapy:  Positive Affirmations   Participation Level:  Active  Description of Group: This group addressed positive affirmation toward self and others. Patients went around the room and identified two positive things about themselves and two positive things about a peer in the room. Patients reflected on how it felt to share something positive with others, to identify positive things about themselves, and to hear positive things from others. Patients were encouraged to have a daily reflection of positive characteristics or circumstances.  Therapeutic Goals Patient will verbalize two of their positive qualities Patient will demonstrate empathy for others by stating two positive qualities about a peer in the group Patient will verbalize their feelings when voicing positive self affirmations and when voicing positive affirmations of others Patients will discuss the potential positive impact on their wellness/recovery of focusing on positive traits of self and others. Summary of Patient Progress:Patient was provided therapeutic worksheets and asked to meet with CSW as needed.    Therapeutic Modalities Cognitive Behavioral Therapy Motivational Interviewing   Mostafa Yuan, LCSWA Clinicial Social Worker [facility]  

## 2021-02-04 NOTE — Progress Notes (Signed)
Recreation Therapy Notes  Date:  7.13.22 Time: 0930 Location: [address] Dayroom  Group Topic: Stress Management  Goal Area(s) Addresses:  Patient will identify positive stress management techniques. Patient will identify benefits of using stress management post d/c.  Intervention: Stress Management  Activity :  Meditation is used to calm the patient to center their attention on breathing techniques to relax.  Patients are sit quietly and follow along with the meditation as it plays to become more and more relaxed with each breath to feel at peace.  Education:  Stress Management, Discharge Planning.   Education Outcome: Acknowledges Education  Clinical Observations/Feedback: Despite invitation to group, pt did not attend group session.     Caroll Rancher, LRT/CTRS         Caroll Rancher A 02/04/2021 12:40 PM

## 2021-02-04 NOTE — [facility] Group Notes (Signed)
[facility] Group Notes:  (Nursing/MHT/Case Management/Adjunct)  Date:  02/04/2021  Time:  8:18 PM  Type of Therapy:  Group Therapy  Participation Level:  Did Not Attend  Participation Quality:   NA  Affect:   NA  Cognitive:   NA  Insight:  None  Engagement in Group:   NA  Modes of Intervention:   NA  Summary of Progress/Problems:  Lorita Officer 02/04/2021, 8:18 PM

## 2021-02-04 NOTE — Tx Team (Signed)
Interdisciplinary Treatment and Diagnostic Plan Update  02/04/2021 Time of Session: 9:20am  Frederick Chandler MRN: [id number]  Principal Diagnosis: <principal problem not specified>  Secondary Diagnoses: Active Problems:   Severe major depression, single episode, without psychotic features (HCC)   Current Medications:  Current Facility-Administered Medications  Medication Dose Route Frequency Provider Last Rate Last Admin   acetaminophen (TYLENOL) tablet 650 mg  650 mg Oral Q6H PRN Rozetta Nunnery, NP       alum & mag hydroxide-simeth (MAALOX/MYLANTA) 200-200-20 MG/5ML suspension 30 mL  30 mL Oral Q4H PRN Lindon Romp A, NP       hydrOXYzine (ATARAX/VISTARIL) tablet 25 mg  25 mg Oral TID PRN Rozetta Nunnery, NP       magnesium hydroxide (MILK OF MAGNESIA) suspension 30 mL  30 mL Oral Daily PRN Lindon Romp A, NP       pantoprazole (PROTONIX) EC tablet 40 mg  40 mg Oral Daily Lindon Romp A, NP   40 mg at 02/04/21 1008   sertraline (ZOLOFT) tablet 25 mg  25 mg Oral Daily Briant Cedar, MD   25 mg at 02/04/21 1325   traZODone (DESYREL) tablet 50 mg  50 mg Oral QHS PRN Rozetta Nunnery, NP       PTA Medications: Medications Prior to Admission  Medication Sig Dispense Refill Last Dose   omeprazole (PRILOSEC) 40 MG capsule Take 1 capsule (40 mg total) by mouth daily. 90 capsule 3     Patient Stressors: Loss of fiancee  Patient Strengths: Communication skills Supportive family/friends  Treatment Modalities: Medication Management, Group therapy, Case management,  1 to 1 session with clinician, Psychoeducation, Recreational therapy.   Physician Treatment Plan for Primary Diagnosis: <principal problem not specified> Long Term Goal(s): Improvement in symptoms so as ready for discharge   Short Term Goals: Ability to identify changes in lifestyle to reduce recurrence of condition will improve Ability to verbalize feelings will improve Ability to disclose and discuss suicidal  ideas Ability to demonstrate self-control will improve Ability to identify and develop effective coping behaviors will improve Compliance with prescribed medications will improve Ability to identify triggers associated with substance abuse/mental health issues will improve  Medication Management: Evaluate patient's response, side effects, and tolerance of medication regimen.  Therapeutic Interventions: 1 to 1 sessions, Unit Group sessions and Medication administration.  Evaluation of Outcomes: Not Met  Physician Treatment Plan for Secondary Diagnosis: Active Problems:   Severe major depression, single episode, without psychotic features ([place])  Long Term Goal(s): Improvement in symptoms so as ready for discharge   Short Term Goals: Ability to identify changes in lifestyle to reduce recurrence of condition will improve Ability to verbalize feelings will improve Ability to disclose and discuss suicidal ideas Ability to demonstrate self-control will improve Ability to identify and develop effective coping behaviors will improve Compliance with prescribed medications will improve Ability to identify triggers associated with substance abuse/mental health issues will improve     Medication Management: Evaluate patient's response, side effects, and tolerance of medication regimen.  Therapeutic Interventions: 1 to 1 sessions, Unit Group sessions and Medication administration.  Evaluation of Outcomes: Not Met   RN Treatment Plan for Primary Diagnosis: <principal problem not specified> Long Term Goal(s): Knowledge of disease and therapeutic regimen to maintain health will improve  Short Term Goals: Ability to remain free from injury will improve, Ability to participate in decision making will improve, Ability to verbalize feelings will improve, Ability to disclose and discuss suicidal ideas,  and Ability to identify and develop effective coping behaviors will improve  Medication Management:  RN will administer medications as ordered by provider, will assess and evaluate patient's response and provide education to patient for prescribed medication. RN will report any adverse and/or side effects to prescribing provider.  Therapeutic Interventions: 1 on 1 counseling sessions, Psychoeducation, Medication administration, Evaluate responses to treatment, Monitor vital signs and CBGs as ordered, Perform/monitor CIWA, COWS, AIMS and Fall Risk screenings as ordered, Perform wound care treatments as ordered.  Evaluation of Outcomes: Not Met   LCSW Treatment Plan for Primary Diagnosis: <principal problem not specified> Long Term Goal(s): Safe transition to appropriate next level of care at discharge, Engage patient in therapeutic group addressing interpersonal concerns.  Short Term Goals: Engage patient in aftercare planning with referrals and resources, Increase social support, Increase emotional regulation, Facilitate acceptance of mental health diagnosis and concerns, Identify triggers associated with mental health/substance abuse issues, and Increase skills for wellness and recovery  Therapeutic Interventions: Assess for all discharge needs, 1 to 1 time with Social worker, Explore available resources and support systems, Assess for adequacy in community support network, Educate family and significant other(s) on suicide prevention, Complete Psychosocial Assessment, Interpersonal group therapy.  Evaluation of Outcomes: Not Met   Progress in Treatment: Attending groups: No. Participating in groups: No. Taking medication as prescribed: Yes. Toleration medication: Yes. Family/Significant other contact made: Yes, individual(s) contacted:  If consents are provided  Patient understands diagnosis: Yes. and No. Discussing patient identified problems/goals with staff: Yes. Medical problems stabilized or resolved: Yes. Denies suicidal/homicidal ideation: Yes. Issues/concerns per patient  self-inventory: No.   New problem(s) identified: No, Describe:  None  New Short Term/Long Term Goal(s): medication stabilization, elimination of SI thoughts, development of comprehensive mental wellness plan.   Patient Goals: "To go home"   Discharge Plan or Barriers: Patient recently admitted. CSW will continue to follow and assess for appropriate referrals and possible discharge planning.   Reason for Continuation of Hospitalization: Anxiety Depression Medication stabilization Suicidal ideation  Estimated Length of Stay: 3 to 5 days   Attendees: Patient: Frederick Chandler  02/04/2021   Physician: Lestine Mount, DO  02/04/2021   Nursing:  02/04/2021   RN Care Manager: 02/04/2021   Social Worker: Verdis Frederickson, [place] 02/04/2021   Recreational Therapist:  02/04/2021   Other:  02/04/2021   Other:  02/04/2021   Other: 02/04/2021     Scribe for Treatment Team: Darleen Crocker, LCSWA 02/04/2021 1:46 PM

## 2021-02-04 NOTE — [facility] Suicide Risk Assessment (Signed)
[facility] Admission Suicide Risk Assessment   Nursing information obtained from:  Patient Demographic factors:  Low socioeconomic status, Caucasian Current Mental Status:  Suicidal ideation indicated by patient, Self-harm thoughts Loss Factors:  Loss of significant relationship Historical Factors:  NA Risk Reduction Factors:  Sense of responsibility to family, Employed, Positive social support  Total Time spent with patient: 20 minutes Principal Problem: <principal problem not specified> Diagnosis:  Active Problems:   Severe major depression, single episode, without psychotic features (HCC)  Subjective Data: Patient is a 30 year old male with a reported past psychiatric history significant for depressive symptoms as well as anxiety who originally presented to the behavioral health urgent care center on 02/03/2021 with suicidal ideation.  He stated that his fiance of the last 7 years had left him approximately 2 days ago, and since she had left he had become more depressed, having anxiety, having panic symptoms, yelling, hitting walls and having suicidal thoughts.  He actually had a weapon in the home and looked at it and contemplated suicide.  He admitted that he had held this gun in his hand while contemplating this.  He contacted a friend who came and remove the weapon and help bring him to the behavioral health urgent care center.  He stated that he had been suicidal for approximately the last couple of weeks as their relationship deteriorated.  He stated that his fiance had also had psychiatric problems and recently checked herself into a psychiatric facility.  Apparently the fianc is transgender now, and that is what has led to the relationship ending.  Continued Clinical Symptoms:    The "Alcohol Use Disorders Identification Test", Guidelines for Use in Primary Care, Second Edition.  World Science writer [facility]). Score between 0-7:  no or low risk or alcohol related problems. Score between  8-15:  moderate risk of alcohol related problems. Score between 16-19:  high risk of alcohol related problems. Score 20 or above:  warrants further diagnostic evaluation for alcohol dependence and treatment.   CLINICAL FACTORS:   Severe Anxiety and/or Agitation Panic Attacks Depression:   Anhedonia Hopelessness Impulsivity   Musculoskeletal: Strength & Muscle Tone: within normal limits Gait & Station: normal Patient leans: N/A  Psychiatric Specialty Exam:  Presentation  General Appearance: Appropriate for Environment; Fairly Groomed  Eye Contact:Poor  Speech:Clear and Coherent; Normal Rate  Speech Volume:Normal  Handedness: No data recorded  Mood and Affect  Mood:Anxious; Depressed; Hopeless; Worthless  Affect:Depressed; Animal nutritionist Processes:Coherent  Descriptions of Associations:Intact  Orientation:Full (Time, Place and Person)  Thought Content:WDL  History of Schizophrenia/Schizoaffective disorder:No  Duration of Psychotic Symptoms:No data recorded Hallucinations:Hallucinations: None  Ideas of Reference:None  Suicidal Thoughts:Suicidal Thoughts: No SI Active Intent and/or Plan: With Intent; With Plan; With Means to Carry Out  Homicidal Thoughts:Homicidal Thoughts: No   Sensorium  Memory:Immediate Good; Recent Good  Judgment:Impaired  Insight:Shallow   Executive Functions  Concentration:Good  Attention Span:Good  Recall:Good  Fund of Knowledge:Good  Language:Good   Psychomotor Activity  Psychomotor Activity:Psychomotor Activity: Restlessness   Assets  Assets:Communication Skills; Desire for Improvement; Resilience; Social Support; Housing   Sleep  Sleep:Sleep: Poor    Physical Exam: Physical Exam Vitals and nursing note reviewed.  HENT:     Head: Normocephalic and atraumatic.  Pulmonary:     Effort: Pulmonary effort is normal.  Neurological:     General: No focal deficit present.     Mental  Status: He is alert and oriented to person, place, and time.  Review of Systems  All other systems reviewed and are negative. Blood pressure 117/84, pulse (!) 133, temperature 98.2 F (36.8 C), temperature source Oral, height 6\' 2"  (1.88 m), weight (!) 137.9 kg, SpO2 100 %. Body mass index is 39.03 kg/m.   COGNITIVE FEATURES THAT CONTRIBUTE TO RISK:  None    SUICIDE RISK:   Moderate:  Frequent suicidal ideation with limited intensity, and duration, some specificity in terms of plans, no associated intent, good self-control, limited dysphoria/symptomatology, some risk factors present, and identifiable protective factors, including available and accessible social support.  PLAN OF CARE: Patient is seen and examined.  Patient is a 30 year old male with the above-stated past psychiatric history who was admitted to the hospital secondary to suicidal ideation and worsening depression.  He will be admitted to the hospital.  He will be integrated in the milieu.  He will be encouraged to attend groups.  I agree with the psychiatric resident's plan to start Zoloft 25 mg p.o. daily, and we will titrate that during the course of the hospitalization.  I also agree with the possibility of cognitive behavioral therapy as well as dialectic behavioral therapy after discharge once he is stabilized.  Review of his admission laboratories revealed essentially normal electrolytes with a normal creatinine at 0.96 and liver function enzymes with an AST of 17 and an ALT of 20.  His lipid panel was essentially normal.  His white blood cell count was mildly elevated at 13.4.  Differential was not obtained.  The rest of his CBC was normal.  His TSH is slightly elevated at 4.586.  We will order a T3 and T4 for completeness sake.  His respiratory panel was negative, but we will order a drug screen in case.  His vital signs are stable although he is tachycardic.  His rate has been between 115 and 133.  We will monitor that.  He is  afebrile, and his pulse oximetry on room air was 100%.  I certify that inpatient services furnished can reasonably be expected to improve the patient's condition.   25, MD 02/04/2021, 12:56 PM
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
## 2021-02-04 NOTE — Progress Notes (Signed)
Pt denies SI/HI/AVH.  Pt presents with flat affect and informed this RN, "I don't want to be here, I need to go home."  Pt cooperated with staff requests and pt was respectful when communicating with pt's and staff.  Pt had labs drawn this evening without incident.  Pt remains safe on the unit with q 15 min safety checks in place.      02/04/21 1030  Psych Admission Type (Psych Patients Only)  Admission Status Voluntary  Psychosocial Assessment  Patient Complaints Depression;Anxiety;Tension  Eye Contact Brief  Facial Expression Pained;Pensive;Flat  Affect Anxious  Speech Logical/coherent  Interaction Assertive  Motor Activity Fidgety;Slow  Appearance/Hygiene Unremarkable  Behavior Characteristics Cooperative;Anxious  Mood Anxious;Depressed  Thought Process  Coherency WDL  Content WDL  Delusions WDL  Perception WDL  Hallucination None reported or observed  Judgment Impaired  Confusion WDL  Danger to Self  Current suicidal ideation? Denies  Danger to Others  Danger to Others None reported or observed

## 2021-02-04 NOTE — Plan of Care (Signed)
Patient newly admitted and requires stabilization of psychiatric symptoms. 

## 2021-02-04 NOTE — B[facility] Counselor (Signed)
CSW attempted to complete PSA with pt but he was sleeping. CSW will continue to attempt throughout the pt's stay.   [name], LCSWA Clinicial Social Worker [employer]

## 2021-02-05 LAB — CBC WITH DIFFERENTIAL/PLATELET
Abs Immature Granulocytes: 0.02 10*3/uL (ref 0.00–0.07)
Basophils Absolute: 0.1 10*3/uL (ref 0.0–0.1)
Basophils Relative: 1 %
Eosinophils Absolute: 0.3 10*3/uL (ref 0.0–0.5)
Eosinophils Relative: 3 %
HCT: 46.5 % (ref 39.0–52.0)
Hemoglobin: 14.8 g/dL (ref 13.0–17.0)
Immature Granulocytes: 0 %
Lymphocytes Relative: 29 %
Lymphs Abs: 2.6 10*3/uL (ref 0.7–4.0)
MCH: 27.5 pg (ref 26.0–34.0)
MCHC: 31.8 g/dL (ref 30.0–36.0)
MCV: 86.3 fL (ref 80.0–100.0)
Monocytes Absolute: 0.8 10*3/uL (ref 0.1–1.0)
Monocytes Relative: 9 %
Neutro Abs: 5.3 10*3/uL (ref 1.7–7.7)
Neutrophils Relative %: 58 %
Platelets: 328 10*3/uL (ref 150–400)
RBC: 5.39 MIL/uL (ref 4.22–5.81)
RDW: 12.8 % (ref 11.5–15.5)
WBC: 9 10*3/uL (ref 4.0–10.5)
nRBC: 0 % (ref 0.0–0.2)

## 2021-02-05 LAB — RAPID URINE DRUG SCREEN, HOSP PERFORMED
Amphetamines: NOT DETECTED
Barbiturates: NOT DETECTED
Benzodiazepines: NOT DETECTED
Cocaine: NOT DETECTED
Opiates: NOT DETECTED
Tetrahydrocannabinol: NOT DETECTED

## 2021-02-05 LAB — COMPREHENSIVE METABOLIC PANEL
ALT: 30 U/L (ref 0–44)
AST: 24 U/L (ref 15–41)
Albumin: 4.3 g/dL (ref 3.5–5.0)
Alkaline Phosphatase: 73 U/L (ref 38–126)
Anion gap: 9 (ref 5–15)
BUN: 12 mg/dL (ref 6–20)
CO2: 25 mmol/L (ref 22–32)
Calcium: 9.1 mg/dL (ref 8.9–10.3)
Chloride: 105 mmol/L (ref 98–111)
Creatinine, Ser: 0.87 mg/dL (ref 0.61–1.24)
GFR, Estimated: >60 mL/min (ref >60)
Glucose, Bld: 113 mg/dL — ABNORMAL HIGH (ref 70–99)
Potassium: 3.5 mmol/L (ref 3.5–5.1)
Sodium: 139 mmol/L (ref 135–145)
Total Bilirubin: 1.2 mg/dL (ref 0.3–1.2)
Total Protein: 8 g/dL (ref 6.5–8.1)

## 2021-02-05 MED ORDER — SERTRALINE HCL 25 MG PO TABS
25.0000 mg | ORAL_TABLET | Freq: Once | ORAL | Status: AC
  Administered 2021-02-05: 25 mg via ORAL
  Filled 2021-02-05 (×2): qty 1

## 2021-02-05 MED ORDER — SERTRALINE HCL 50 MG PO TABS
50.0000 mg | ORAL_TABLET | Freq: Every day | ORAL | Status: DC
  Administered 2021-02-06: 50 mg via ORAL
  Filled 2021-02-05 (×2): qty 1

## 2021-02-05 NOTE — [facility] Group Notes (Signed)
[facility] Group Notes:  (Nursing/MHT/Case Management/Adjunct)  Date:  02/05/2021  Time:  8:32 PM  Type of Therapy:  Group Therapy  Participation Level:  Did Not Attend  Participation Quality:   NA  Affect:   NA  Cognitive:   NA  Insight:  None  Engagement in Group:   NA  Modes of Intervention:   NA  Summary of Progress/Problems:  Lorita Officer 02/05/2021, 8:32 PM
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
## 2021-02-05 NOTE — BHH Counselor (Signed)
CSW gave the following information to registration regarding pt's insurance coverage: [facility] Subscriber Name: Frederick Chandler Subscriber ID: [id number] Group #: [id number]. A copy of the front and back of the insurance card was also provided  [name], [employer] Clinicial Social Worker [employer]

## 2021-02-05 NOTE — Progress Notes (Signed)
Pt disclosed that he has talked with his ex-fiance and ex-fiance has mental health issues and is currently hospitalized inpatient at a [facility] facility.  Pt said that his goal is to lose weight and take better care of himself.  Pt feels he is ready for d/c and hopes he can leave tomorrow.  Pt remains safe on the unit with q 15 min checks in place.      02/05/21 0815  Psych Admission Type (Psych Patients Only)  Admission Status Voluntary  Psychosocial Assessment  Patient Complaints Depression;Anxiety  Eye Contact Fair  Facial Expression Pained;Sad  Affect Anxious  Speech Logical/coherent  Interaction Assertive  Motor Activity Slow  Appearance/Hygiene Unremarkable  Behavior Characteristics Cooperative;Appropriate to situation  Mood Anxious;Depressed  Thought Process  Coherency WDL  Content WDL  Delusions WDL  Perception WDL  Hallucination None reported or observed  Judgment WDL  Confusion WDL  Danger to Self  Current suicidal ideation? Denies  Danger to Others  Danger to Others None reported or observed

## 2021-02-05 NOTE — Progress Notes (Signed)
[facility] MD Progress Note  02/05/2021 7:02 AM Frederick Chandler  MRN:  [id number] Subjective:   [name] is a 29 yr old male who presents with depression and SI. PPHx is significant for depression and anxiety.  He reports that he is doing "ok" today. He reports that yesterday he spoke with his fiancee and learned that she has been admitted to [facility]. He reports that this is comforting to him because it means she will be getting the help she needs. He reports he knows they will not necessarily get back together with her but still is feeling better because of it. He reports that he has tolerated the Zoloft well with no side effects. He reports no SI, HI, or AVH. He reports he slept ok. He reports his appetite has been ok. He reports that he has insurance from his work. Asked if he had his insurance card with him. He reports that it is in his wallet in the locker. He gave permission for me to get the card to copy so that therapy could be arranged. He had no other concerns at present.    I went to the patient lockers and with security present retrieved patients wallet. Located the insurance card and took it to Social Work. Social work made copy of the card and then card was replaced in patients wallet and belongings locked back in locker.   Principal Problem: <principal problem not specified> Diagnosis: Active Problems:   Severe major depression, single episode, without psychotic features (HCC)  Total Time spent with patient: 45 minutes  Past Psychiatric History: Depression and Anxiety  Past Medical History:  Past Medical History:  Diagnosis Date   GERD (gastroesophageal reflux disease)     Past Surgical History:  Procedure Laterality Date   COLONOSCOPY WITH PROPOFOL N/A 04/21/2017   Procedure: COLONOSCOPY WITH PROPOFOL;  Surgeon: Wyline Mood, MD;  Location: [facility] ENDOSCOPY;  Service: Gastroenterology;  Laterality: N/A;   ESOPHAGOGASTRODUODENOSCOPY (EGD) WITH PROPOFOL N/A 04/21/2017    Procedure: ESOPHAGOGASTRODUODENOSCOPY (EGD) WITH PROPOFOL;  Surgeon: Wyline Mood, MD;  Location[facility] END[facility]COPY;  Service: Gastroenterology;  Laterality: N/A;   Family History: History reviewed. No pertinent family history. Family Psychiatric  History: Reports No Known Social History:  Social History   Substance and Sexual Activity  Alcohol Use No     Social History   Substance and Sexual Activity  Drug Use No    Social History   Socioeconomic History   Marital status: Single    Spouse name: Not on file   Number of children: Not on file   Years of education: Not on file   Highest education level: Not on file  Occupational History   Not on file  Tobacco Use   Smoking status: Never   Smokeless tobacco: Never  Substance and Sexual Activity   Alcohol use: No   Drug use: No   Sexual activity: Not on file  Other Topics Concern   Not on file  Social History Narrative   Not on file   Social Determinants of Health   Financial Resource Strain: Not on file  Food Insecurity: Not on file  Transportation Needs: Not on file  Physical Activity: Not on file  Stress: Not on file  Social Connections: Not on file   Additional Social History:    Pain Medications: None Prescriptions: None Over the Counter: None History of alcohol / drug use?: No history of alcohol / drug abuse  Sleep: Good  Appetite:  Good  Current Medications: Current Facility-Administered Medications  Medication Dose Route Frequency Provider Last Rate Last Admin   acetaminophen (TYLENOL) tablet 650 mg  650 mg Oral Q6H PRN Jackelyn Poling, NP   650 mg at 02/04/21 2118   alum & mag hydroxide-simeth (MAALOX/MYLANTA) 200-200-20 MG/5ML suspension 30 mL  30 mL Oral Q4H PRN Jackelyn Poling, NP       hydrOXYzine (ATARAX/VISTARIL) tablet 25 mg  25 mg Oral TID PRN Jackelyn Poling, NP       magnesium hydroxide (MILK OF MAGNESIA) suspension 30 mL  30 mL Oral Daily PRN Jackelyn Poling, NP        pantoprazole (PROTONIX) EC tablet 40 mg  40 mg Oral Daily Nira Conn A, NP   40 mg at 02/04/21 1008   sertraline (ZOLOFT) tablet 25 mg  25 mg Oral Daily Lauro Franklin, MD   25 mg at 02/04/21 1325   traZODone (DESYREL) tablet 50 mg  50 mg Oral QHS PRN Jackelyn Poling, NP        Lab Results:  Results for orders placed or performed during the hospital encounter of 02/03/21 (from the past 48 hour(s))  Resp Panel by RT-PCR (Flu A&B, Covid) Nasopharyngeal Swab     Status: None   Collection Time: 02/03/21  9:28 PM   Specimen: Nasopharyngeal Swab; Nasopharyngeal(NP) swabs in vial transport medium  Result Value Ref Range   SARS Coronavirus 2 by RT PCR NEGATIVE NEGATIVE    Comment: (NOTE) SARS-CoV-2 target nucleic acids are NOT DETECTED.  The SARS-CoV-2 RNA is generally detectable in upper respiratory specimens during the acute phase of infection. The lowest concentration of SARS-CoV-2 viral copies this assay can detect is 138 copies/mL. A negative result does not preclude SARS-Cov-2 infection and should not be used as the sole basis for treatment or other patient management decisions. A negative result may occur with  improper specimen collection/handling, submission of specimen other than nasopharyngeal swab, presence of viral mutation(s) within the areas targeted by this assay, and inadequate number of viral copies(<138 copies/mL). A negative result must be combined with clinical observations, patient history, and epidemiological information. The expected result is Negative.  Fact Sheet for Patients:  BloggerCourse.com  Fact Sheet for Healthcare Providers:  SeriousBroker.it  This test is no t yet approved or cleared by the [place] FDA and  has been authorized for detection and/or diagnosis of SARS-CoV-2 by FDA under an Emergency Use Authorization (EUA). This EUA will remain  in effect (meaning this test can be used) for the  duration of the COVID-19 declaration under Section 564(b)(1) of the Act, 21 U.S.C.section 360bbb-3(b)(1), unless the authorization is terminated  or revoked sooner.       Influenza A by PCR NEGATIVE NEGATIVE   Influenza B by PCR NEGATIVE NEGATIVE    Comment: (NOTE) The Xpert Xpress SARS-CoV-2/FLU/RSV plus assay is intended as an aid in the diagnosis of influenza from Nasopharyngeal swab specimens and should not be used as a sole basis for treatment. Nasal washings and aspirates are unacceptable for Xpert Xpress SARS-CoV-2/FLU/RSV testing.  Fact Sheet for Patients: BloggerCourse.com  Fact Sheet for Healthcare Providers: SeriousBroker.it  This test is not yet approved or cleared by the [place] FDA and has been authorized for detection and/or diagnosis of SARS-CoV-2 by FDA under an Emergency Use Authorization (EUA). This EUA will remain in effect (meaning this test can be used) for the duration of the COVID-19 declaration under  Section 564(b)(1) of the Act, 21 U.S.C. section 360bbb-3(b)(1), unless the authorization is terminated or revoked.  Performed at [facility], [address] [place], [place] [address]   CBC     Status: Abnormal   Collection Time: 02/04/21  6:11 AM  Result Value Ref Range   WBC 13.4 (H) 4.0 - 10.5 K/uL   RBC 5.28 4.22 - 5.81 MIL/uL   Hemoglobin 14.6 13.0 - 17.0 g/dL   HCT 28.444.8 13.239.0 - 44.052.0 %   MCV 84.8 80.0 - 100.0 fL   MCH 27.7 26.0 - 34.0 pg   MCHC 32.6 30.0 - 36.0 g/dL   RDW 10.212.8 72.511.5 - 36.615.5 %   Platelets 363 150 - 400 K/uL   nRBC 0.0 0.0 - 0.2 %    Comment: Performed at [facility], [address] [place], [place] [address]  Comprehensive metabolic panel     Status: Abnormal   Collection Time: 02/04/21  6:11 AM  Result Value Ref Range   Sodium 138 135 - 145 mmol/L   Potassium 3.8 3.5 - 5.1 mmol/L   Chloride 104 98 - 111 mmol/L   CO2 23 22 - 32  mmol/L   Glucose, Bld 112 (H) 70 - 99 mg/dL    Comment: Glucose reference range applies only to samples taken after fasting for at least 8 hours.   BUN 10 6 - 20 mg/dL   Creatinine, Ser 7.420.96 0.61 - 1.24 mg/dL   Calcium 9.5 8.9 - 59.510.3 mg/dL   Total Protein 8.4 (H) 6.5 - 8.1 g/dL   Albumin 4.4 3.5 - 5.0 g/dL   AST 17 15 - 41 U/L   ALT 20 0 - 44 U/L   Alkaline Phosphatase 72 38 - 126 U/L   Total Bilirubin 1.1 0.3 - 1.2 mg/dL   GFR, Estimated >63>60 >87>60 mL/min    Comment: (NOTE) Calculated using the CKD-EPI Creatinine Equation (2021)    Anion gap 11 5 - 15    Comment: Performed at [facility], [address] [place], [place] [address]  Hemoglobin A1c     Status: None   Collection Time: 02/04/21  6:11 AM  Result Value Ref Range   Hgb A1c MFr Bld 5.5 4.8 - 5.6 %    Comment: (NOTE) Pre diabetes:          5.7%-6.4%  Diabetes:              >6.4%  Glycemic control for   <7.0% adults with diabetes    Mean Plasma Glucose 111.15 mg/dL    Comment: Performed at [facility], [address] [place], [place] [address]  Lipid panel     Status: Abnormal   Collection Time: 02/04/21  6:11 AM  Result Value Ref Range   Cholesterol 157 0 - 200 mg/dL   Triglycerides 66 <841<150 mg/dL   HDL 39 (L) >66>40 mg/dL   Total CHOL/HDL Ratio 4.0 RATIO   VLDL 13 0 - 40 mg/dL   LDL Cholesterol 063105 (H) 0 - 99 mg/dL    Comment:        Total Cholesterol/HDL:CHD Risk Coronary Heart Disease Risk Table                     Men   Women  1/2 Average Risk   3.4   3.3  Average Risk       5.0   4.4  2 X Average Risk   9.6  7.1  3 X Average Risk  23.4   11.0        Use the calculated Patient Ratio above and the CHD Risk Table to determine the patient's CHD Risk.        ATP III CLASSIFICATION (LDL):  <100     mg/dL   Optimal  176-160  mg/dL   Near or Above                    Optimal  130-159  mg/dL   Borderline  737-106  mg/dL   High  >269     mg/dL   Very High Performed at [facility], [address], [place], [place] [address]   TSH     Status: Abnormal   Collection Time: 02/04/21  6:11 AM  Result Value Ref Range   TSH 4.586 (H) 0.350 - 4.500 uIU/mL    Comment: Performed by a 3rd Generation assay with a functional sensitivity of <=0.01 uIU/mL. Performed at [facility], [address] [place], [place] [address]   T4, free     Status: Abnormal   Collection Time: 02/04/21  6:34 PM  Result Value Ref Range   Free T4 1.25 (H) 0.61 - 1.12 ng/dL    Comment: (NOTE) Biotin ingestion may interfere with free T4 tests. If the results are inconsistent with the TSH level, previous test results, or the clinical presentation, then consider biotin interference. If needed, order repeat testing after stopping biotin. Performed at [facility], [address] [place], [place] [address]     Blood Alcohol level:  No results found for: [facility]  Metabolic Disorder Labs: Lab Results  Component Value Date   HGBA1C 5.5 02/04/2021   MPG 111.15 02/04/2021   No results found for: PROLACTIN Lab Results  Component Value Date   CHOL 157 02/04/2021   TRIG 66 02/04/2021   HDL 39 (L) 02/04/2021   CHOLHDL 4.0 02/04/2021   VLDL 13 02/04/2021   LDLCALC 105 (H) 02/04/2021    Physical Findings: AIMS:  , ,  ,  ,    CIWA:    COWS:     Musculoskeletal: Strength & Muscle Tone: within normal limits Gait & Station: normal Patient leans: N/A  Psychiatric Specialty Exam:  Presentation  General Appearance: Appropriate for Environment; Fairly Groomed  Eye Contact:Poor  Speech:Clear and Coherent; Normal Rate  Speech Volume:Normal  Handedness: No data recorded  Mood and Affect  Mood:Anxious; Depressed; Hopeless; Worthless  Affect:Depressed; Animal nutritionist Processes:Coherent  Descriptions of Associations:Intact  Orientation:Full (Time, Place and Person)  Thought Content:WDL  History of  Schizophrenia/Schizoaffective disorder:No  Duration of Psychotic Symptoms:No data recorded Hallucinations:Hallucinations: None  Ideas of Reference:None  Suicidal Thoughts:Suicidal Thoughts: No  Homicidal Thoughts:Homicidal Thoughts: No   Sensorium  Memory:Immediate Good; Recent Good  Judgment:Impaired  Insight:Shallow   Executive Functions  Concentration:Good  Attention Span:Good  Recall:Good  Fund of Knowledge:Good  Language:Good   Psychomotor Activity  Psychomotor Activity:Psychomotor Activity: Restlessness   Assets  Assets:Communication Skills; Desire for Improvement; Resilience; Social Support; Housing   Sleep  Sleep:Sleep: Poor    Physical Exam: Physical Exam Vitals and nursing note reviewed.   ROS Blood pressure 123/90, pulse (!) 112, temperature 97.9 F (36.6 C), temperature source Oral, height 6\' 2"  (1.88 m), weight (!) 137.9 kg, SpO2 95 %. Body mass index is 39.03 kg/m.   Treatment Plan Summary: Daily contact with patient to assess and evaluate symptoms  and progress in treatment   [name] is a 30 yr old male who presents with depression and SI. PPHx is significant for depression and anxiety.   He has tolerated starting Zoloft so will increase. Since he has insurance will have Social Work provide the application for CBT clinic. Will monitor and plan for discharge tomorrow.    Adjustment Disorder Mixed State: -Increase Zoloft to 50 mg daily   -Continue PRN's: Tylenol, Maalox, Atarax, Milk of Magnesia, Trazodone   Lauro Franklin, MD 02/05/2021, 7:02 AM

## 2021-02-05 NOTE — Progress Notes (Signed)
Frederick Chandler was noted pacing the halls.  He did not attend evening wrap up group.  He denied any SI/HI or AVH.  He states that he is doing fine and hopeful that he will be discharged tomorrow.  He did request trazodone for sleep, no other prn's given.  He is currently resting with his eyes closed and appears to be asleep.  Q 15 minute checks maintained for safety.   02/05/21 2100  Psych Admission Type (Psych Patients Only)  Admission Status Voluntary  Psychosocial Assessment  Patient Complaints None  Eye Contact Fair  Facial Expression Anxious  Affect Anxious  Speech Logical/coherent  Interaction Assertive  Motor Activity Other (Comment) (Unremarkable)  Appearance/Hygiene Unremarkable  Behavior Characteristics Cooperative;Appropriate to situation  Mood Anxious  Thought Process  Coherency WDL  Content WDL  Delusions WDL  Perception WDL  Hallucination None reported or observed  Judgment WDL  Confusion WDL  Danger to Self  Current suicidal ideation? Denies  Danger to Others  Danger to Others None reported or observed

## 2021-02-05 NOTE — Progress Notes (Signed)
Frederick Chandler was isolative to his room much of the evening.  He did not attend evening wrap up group.  He did come out of his room for medications which he took without incident.  He denied SI/HI or AVH.  He did get up later stating "I just need to get out of my room for a minute."  He chose to sit quietly in the alcove.  Staff tried to engage him in conversation multiple times but declined stating "I'm fine."  He did get up after 15 minutes and returned to his room.  He declined trazodone for sleep.  He is currently resting quietly and appears to be asleep.  Q 15 minute checks maintained for safety.    02/04/21 2118  Psych Admission Type (Psych Patients Only)  Admission Status Voluntary  Psychosocial Assessment  Patient Complaints Anxiety;Depression;Insomnia  Eye Contact Fair  Facial Expression Pained;Sad  Affect Anxious  Speech Logical/coherent  Interaction Assertive  Motor Activity Restless  Appearance/Hygiene Unremarkable  Behavior Characteristics Cooperative;Appropriate to situation  Mood Depressed;Anxious  Thought Process  Coherency WDL  Content WDL  Delusions WDL  Perception WDL  Hallucination None reported or observed  Judgment Impaired  Confusion WDL  Danger to Self  Current suicidal ideation? Denies  Danger to Others  Danger to Others None reported or observed

## 2021-02-05 NOTE — Progress Notes (Signed)
Pt did not attend goals group. 

## 2021-02-06 LAB — T3 UPTAKE: T3 Uptake Ratio: 32 % (ref 24–39)

## 2021-02-06 MED ORDER — HYDROXYZINE HCL 25 MG PO TABS: 25.0000 mg | ORAL_TABLET | Freq: Three times a day (TID) | ORAL | 0 refills | Status: AC | PRN

## 2021-02-06 MED ORDER — SERTRALINE HCL 50 MG PO TABS: 50.0000 mg | ORAL_TABLET | Freq: Every day | ORAL | 0 refills | Status: AC

## 2021-02-06 NOTE — Progress Notes (Signed)
  [facility] Adult Case Management Discharge Plan :  Will you be returning to the same living situation after discharge:  Yes,  to home At discharge, do you have transportation home?: Yes,  friend to pick this pt up Do you have the ability to pay for your medications: Yes,  has insurance  Release of information consent forms completed and in the chart;  Patient's signature needed at discharge.  Patient to Follow up at:  Follow-up Information     [facility] Go on 02/11/2021.   Why: You have a hospital follow up appointment for therapy and medication management services on 02/11/21 at 2:30 pm.   This appointment will be held in person. Contact information: [address] [place] [address] [phone]         [facility] Schedule an appointment as soon as possible for a visit.   Why: A referral has been made to this provider for DBT therapy services.  Please call the provider to personally schedule an appointment. Contact information: [address] [place] [address] [phone]                 Next level of care provider has access to [facility] Link:no  Safety Planning and Suicide Prevention discussed: Yes,  with friend     Has patient been referred to the Quitline?: N/A patient is not a smoker  Patient has been referred for addiction treatment: N/A  Otelia Santee, LCSW 02/06/2021, 9:59 AM

## 2021-02-06 NOTE — Plan of Care (Signed)
  Problem: Role Relationship: Goal: Will demonstrate positive changes in social behaviors and relationships Outcome: Progressing   Problem: Safety: Goal: Ability to disclose and discuss suicidal ideas will improve Outcome: Progressing Goal: Ability to identify and utilize support systems that promote safety will improve Outcome: Progressing   
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
## 2021-02-06 NOTE — Plan of Care (Signed)
Discharge note  Patient verbalizes readiness for discharge. Follow up plan explained, AVS, Transition record and SRA given. Prescriptions and teaching provided. Belongings returned and signed for. Suicide safety plan completed and signed. Patient verbalizes understanding. Patient denies SI/HI and assures this Clinical research associate they will seek assistance should that change. Patient discharged to lobby where family was waiting.  Problem: Role Relationship: Goal: Will demonstrate positive changes in social behaviors and relationships 02/06/2021 1137 by Raylene Miyamoto, RN Outcome: Adequate for Discharge 02/06/2021 0845 by Raylene Miyamoto, RN Outcome: Progressing   Problem: Safety: Goal: Ability to disclose and discuss suicidal ideas will improve 02/06/2021 1137 by Raylene Miyamoto, RN Outcome: Adequate for Discharge 02/06/2021 0845 by Raylene Miyamoto, RN Outcome: Progressing Goal: Ability to identify and utilize support systems that promote safety will improve 02/06/2021 1137 by Raylene Miyamoto, RN Outcome: Adequate for Discharge 02/06/2021 0845 by Raylene Miyamoto, RN Outcome: Progressing   Problem: Self-Concept: Goal: Will verbalize positive feelings about self Outcome: Adequate for Discharge Goal: Level of anxiety will decrease Outcome: Adequate for Discharge   Problem: Education: Goal: Knowledge of [place] [facility] Education information/materials will improve Outcome: Adequate for Discharge Goal: Emotional status will improve Outcome: Adequate for Discharge Goal: Mental status will improve Outcome: Adequate for Discharge Goal: Verbalization of understanding the information provided will improve Outcome: Adequate for Discharge   Problem: Activity: Goal: Interest or engagement in activities will improve Outcome: Adequate for Discharge Goal: Sleeping patterns will improve Outcome: Adequate for Discharge   Problem: Coping: Goal: Ability to verbalize frustrations and anger  appropriately will improve Outcome: Adequate for Discharge Goal: Ability to demonstrate self-control will improve Outcome: Adequate for Discharge   Problem: Health Behavior/Discharge Planning: Goal: Identification of resources available to assist in meeting health care needs will improve Outcome: Adequate for Discharge Goal: Compliance with treatment plan for underlying cause of condition will improve Outcome: Adequate for Discharge   Problem: Physical Regulation: Goal: Ability to maintain clinical measurements within normal limits will improve Outcome: Adequate for Discharge   Problem: Safety: Goal: Periods of time without injury will increase Outcome: Adequate for Discharge

## 2021-02-06 NOTE — Progress Notes (Signed)
Recreation Therapy Notes  Date: 7.15.22 Time: 0930 Location: [address]   Group Topic: Stress Management   Goal Area(s) Addresses: Patient will actively participate in stress management techniques presented during session. Patient will successfully identify benefit of practicing stress management post d/c.   Intervention: Meditation with ambient sound and script   Activity:  Meditation   LRT explained the stress management technique of meditation to patients. Patients was asked to participate in the technique introduced during session. LRT also debriefed patients on the topic. Patients were given suggestions of where to access meditations post d/c and encouraged to explore Youtube and other apps available on smartphones, tablets, and computers.   Education:  Stress Management, Discharge Planning.   Education Outcome: Acknowledges education   Clinical Observations/Feedback: Patient did not attend group session.       Caroll Rancher, LRT/CTRS         Caroll Rancher A 02/06/2021 11:14 AM

## 2021-02-06 NOTE — [facility] Suicide Risk Assessment (Signed)
[facility] Discharge Suicide Risk Assessment   Principal Problem: <principal problem not specified> Discharge Diagnoses: Active Problems:   Severe major depression, single episode, without psychotic features (HCC)   Total Time spent with patient: 15 minutes  Musculoskeletal: Strength & Muscle Tone: within normal limits Gait & Station: normal Patient leans: N/A  Psychiatric Specialty Exam  Presentation  General Appearance: Appropriate for Environment; Fairly Groomed  Eye Contact:Poor  Speech:Clear and Coherent; Normal Rate  Speech Volume:Normal  Handedness: No data recorded  Mood and Affect  Mood:Anxious; Depressed; Hopeless; Worthless  Duration of Depression Symptoms: Less than two weeks  Affect:Depressed; Flat   Thought Process  Thought Processes:Coherent  Descriptions of Associations:Intact  Orientation:Full (Time, Place and Person)  Thought Content:WDL  History of Schizophrenia/Schizoaffective disorder:No  Duration of Psychotic Symptoms:No data recorded Hallucinations:No data recorded Ideas of Reference:None  Suicidal Thoughts:No data recorded Homicidal Thoughts:No data recorded  Sensorium  Memory:Immediate Good; Recent Good  Judgment:Impaired  Insight:Shallow   Executive Functions  Concentration:Good  Attention Span:Good  Recall:Good  Fund of Knowledge:Good  Language:Good   Psychomotor Activity  Psychomotor Activity: No data recorded  Assets  Assets:Communication Skills; Desire for Improvement; Resilience; Social Support; Housing   Sleep  Sleep: No data recorded  Physical Exam: Physical Exam Vitals and nursing note reviewed.  Constitutional:      Appearance: Normal appearance.  HENT:     Head: Normocephalic and atraumatic.  Pulmonary:     Effort: Pulmonary effort is normal.  Neurological:     General: No focal deficit present.     Mental Status: He is alert and oriented to person, place, and time.   Review of Systems   All other systems reviewed and are negative. Blood pressure 126/84, pulse 95, temperature 97.9 F (36.6 C), temperature source Oral, height 6\' 2"  (1.88 m), weight (!) 137.9 kg, SpO2 95 %. Body mass index is 39.03 kg/m.  Mental Status Per Nursing Assessment::   On Admission:  Suicidal ideation indicated by patient, Self-harm thoughts  Demographic Factors:  Male and Caucasian  Loss Factors: Loss of significant relationship  Historical Factors: Impulsivity  Risk Reduction Factors:   Sense of responsibility to family, Employed, and Living with another person, especially a relative  Continued Clinical Symptoms:  Depression:   Impulsivity  Cognitive Features That Contribute To Risk:  Polarized thinking    Suicide Risk:  Minimal: No identifiable suicidal ideation.  Patients presenting with no risk factors but with morbid ruminations; may be classified as minimal risk based on the severity of the depressive symptoms   Follow-up Information     [phone] Go on 02/11/2021.   Why: You have a hospital follow up appointment for therapy and medication management services on 02/11/21 at 2:30 pm.   This appointment will be held in person. Contact information: [address] [place] [phone]         [facility] Schedule an appointment as soon as possible for a visit.   Why: A referral has been made to this provider for DBT therapy services.  Please call the provider to personally schedule an appointment. Contact information: [address] [place] [phone]                 Plan Of Care/Follow-up recommendations:  Activity:  ad lib  [phone], MD 02/06/2021, 9:36 AM

## 2021-06-05 ENCOUNTER — Emergency Department: Payer: BC Managed Care – PPO

## 2021-06-05 ENCOUNTER — Other Ambulatory Visit: Payer: Self-pay

## 2021-06-05 ENCOUNTER — Emergency Department
Admission: EM | Admit: 2021-06-05 | Discharge: 2021-06-05 | Disposition: A | Discharge status: Home / Self Care | Payer: BC Managed Care – PPO | Attending: Emergency Medicine | Admitting: Emergency Medicine

## 2021-06-05 ENCOUNTER — Encounter: Payer: Self-pay | Admitting: Radiology

## 2021-06-05 DIAGNOSIS — E039 Hypothyroidism, unspecified: Secondary | ICD-10-CM | POA: Insufficient documentation

## 2021-06-05 DIAGNOSIS — S3991XA Unspecified injury of abdomen, initial encounter: Secondary | ICD-10-CM | POA: Diagnosis present

## 2021-06-05 DIAGNOSIS — S301XXA Contusion of abdominal wall, initial encounter: Secondary | ICD-10-CM | POA: Diagnosis not present

## 2021-06-05 DIAGNOSIS — R0789 Other chest pain: Secondary | ICD-10-CM | POA: Insufficient documentation

## 2021-06-05 DIAGNOSIS — S0990XA Unspecified injury of head, initial encounter: Secondary | ICD-10-CM | POA: Insufficient documentation

## 2021-06-05 DIAGNOSIS — Y92410 Unspecified street and highway as the place of occurrence of the external cause: Secondary | ICD-10-CM | POA: Insufficient documentation

## 2021-06-05 LAB — CBC WITH DIFFERENTIAL/PLATELET
Abs Immature Granulocytes: 0.09 10*3/uL — ABNORMAL HIGH (ref 0.00–0.07)
Basophils Absolute: 0.1 10*3/uL (ref 0.0–0.1)
Basophils Relative: 0 %
Eosinophils Absolute: 0.2 10*3/uL (ref 0.0–0.5)
Eosinophils Relative: 1 %
HCT: 45.5 % (ref 39.0–52.0)
Hemoglobin: 14.9 g/dL (ref 13.0–17.0)
Immature Granulocytes: 1 %
Lymphocytes Relative: 13 %
Lymphs Abs: 1.8 10*3/uL (ref 0.7–4.0)
MCH: 28.2 pg (ref 26.0–34.0)
MCHC: 32.7 g/dL (ref 30.0–36.0)
MCV: 86.2 fL (ref 80.0–100.0)
Monocytes Absolute: 0.7 10*3/uL (ref 0.1–1.0)
Monocytes Relative: 5 %
Neutro Abs: 11.3 10*3/uL — ABNORMAL HIGH (ref 1.7–7.7)
Neutrophils Relative %: 80 %
Platelets: 321 10*3/uL (ref 150–400)
RBC: 5.28 MIL/uL (ref 4.22–5.81)
RDW: 12.9 % (ref 11.5–15.5)
WBC: 14.1 10*3/uL — ABNORMAL HIGH (ref 4.0–10.5)
nRBC: 0 % (ref 0.0–0.2)

## 2021-06-05 LAB — COMPREHENSIVE METABOLIC PANEL
ALT: 19 U/L (ref 0–44)
AST: 22 U/L (ref 15–41)
Albumin: 4.3 g/dL (ref 3.5–5.0)
Alkaline Phosphatase: 71 U/L (ref 38–126)
Anion gap: 10 (ref 5–15)
BUN: 15 mg/dL (ref 6–20)
CO2: 28 mmol/L (ref 22–32)
Calcium: 9.5 mg/dL (ref 8.9–10.3)
Chloride: 101 mmol/L (ref 98–111)
Creatinine, Ser: 0.93 mg/dL (ref 0.61–1.24)
GFR, Estimated: >60 mL/min (ref >60)
Glucose, Bld: 117 mg/dL — ABNORMAL HIGH (ref 70–99)
Potassium: 4.2 mmol/L (ref 3.5–5.1)
Sodium: 139 mmol/L (ref 135–145)
Total Bilirubin: 0.6 mg/dL (ref 0.3–1.2)
Total Protein: 8.2 g/dL — ABNORMAL HIGH (ref 6.5–8.1)

## 2021-06-05 MED ORDER — MELOXICAM 15 MG PO TABS: 15.0000 mg | ORAL_TABLET | Freq: Every day | ORAL | 2 refills | Status: AC

## 2021-06-05 MED ORDER — IOHEXOL 300 MG/ML  SOLN
100.0000 mL | Freq: Once | INTRAMUSCULAR | Status: AC | PRN
  Administered 2021-06-05: 100 mL via INTRAVENOUS
  Filled 2021-06-05: qty 100

## 2021-06-05 MED ORDER — METHOCARBAMOL 500 MG PO TABS: 500.0000 mg | ORAL_TABLET | Freq: Three times a day (TID) | ORAL | 0 refills | Status: AC | PRN

## 2021-06-05 NOTE — ED Notes (Signed)
Dc ppw provided. Pt iv removed. Pt rx information provided. Pt followup information given. Pt assisted off unit on foot,

## 2021-06-05 NOTE — Discharge Instructions (Addendum)
Take Meloxicam and Robaxin for pain and inflammation.
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
